# Patient Record
Sex: Male | Born: 1980 | Race: White | Hispanic: No | Marital: Single | Smoking: Former smoker
Health system: Southern US, Community
[De-identification: ages and names within clinical notes are randomized; demographics above are authoritative.]

## PROBLEM LIST (undated history)

## (undated) DIAGNOSIS — K5790 Diverticulosis of intestine, part unspecified, without perforation or abscess without bleeding: Secondary | ICD-10-CM

## (undated) DIAGNOSIS — E119 Type 2 diabetes mellitus without complications: Secondary | ICD-10-CM

## (undated) HISTORY — PX: INNER EAR SURGERY: SHX679

## (undated) HISTORY — PX: TONSILLECTOMY: SUR1361

---

## 2000-06-27 ENCOUNTER — Emergency Department (HOSPITAL_COMMUNITY): Admission: EM | Admit: 2000-06-27 | Discharge: 2000-06-27 | Payer: Self-pay | Admitting: Emergency Medicine

## 2000-06-27 ENCOUNTER — Encounter: Payer: Self-pay | Admitting: Emergency Medicine

## 2006-10-08 ENCOUNTER — Emergency Department (HOSPITAL_COMMUNITY): Admission: EM | Admit: 2006-10-08 | Discharge: 2006-10-08 | Payer: Self-pay | Admitting: Family Medicine

## 2006-11-04 ENCOUNTER — Emergency Department (HOSPITAL_COMMUNITY): Admission: EM | Admit: 2006-11-04 | Discharge: 2006-11-04 | Payer: Self-pay | Admitting: Emergency Medicine

## 2007-09-11 ENCOUNTER — Emergency Department (HOSPITAL_COMMUNITY): Admission: EM | Admit: 2007-09-11 | Discharge: 2007-09-11 | Payer: Self-pay | Admitting: Emergency Medicine

## 2009-04-17 ENCOUNTER — Emergency Department (HOSPITAL_COMMUNITY): Admission: EM | Admit: 2009-04-17 | Discharge: 2009-04-18 | Payer: Self-pay | Admitting: Emergency Medicine

## 2009-10-01 ENCOUNTER — Emergency Department (HOSPITAL_COMMUNITY): Admission: EM | Admit: 2009-10-01 | Discharge: 2009-10-01 | Payer: Self-pay | Admitting: Emergency Medicine

## 2011-11-08 ENCOUNTER — Emergency Department (HOSPITAL_COMMUNITY)
Admission: EM | Admit: 2011-11-08 | Discharge: 2011-11-08 | Disposition: A | Discharge status: Home / Self Care | Payer: No Typology Code available for payment source | Attending: Emergency Medicine | Admitting: Emergency Medicine

## 2011-11-08 ENCOUNTER — Encounter (HOSPITAL_COMMUNITY): Payer: Self-pay | Admitting: Emergency Medicine

## 2011-11-08 DIAGNOSIS — R509 Fever, unspecified: Secondary | ICD-10-CM | POA: Insufficient documentation

## 2011-11-08 DIAGNOSIS — Y92410 Unspecified street and highway as the place of occurrence of the external cause: Secondary | ICD-10-CM | POA: Insufficient documentation

## 2011-11-08 DIAGNOSIS — S139XXA Sprain of joints and ligaments of unspecified parts of neck, initial encounter: Secondary | ICD-10-CM | POA: Insufficient documentation

## 2011-11-08 DIAGNOSIS — T148XXA Other injury of unspecified body region, initial encounter: Secondary | ICD-10-CM

## 2011-11-08 MED ORDER — IBUPROFEN 800 MG PO TABS: 800.0000 mg | ORAL_TABLET | Freq: Three times a day (TID) | ORAL | Status: AC

## 2011-11-08 MED ORDER — IBUPROFEN 800 MG PO TABS
800.0000 mg | ORAL_TABLET | Freq: Once | ORAL | Status: AC
  Administered 2011-11-08: 800 mg via ORAL
  Filled 2011-11-08: qty 1

## 2011-11-08 MED ORDER — CYCLOBENZAPRINE HCL 10 MG PO TABS: 10.0000 mg | ORAL_TABLET | Freq: Two times a day (BID) | ORAL | Status: AC | PRN
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
## 2011-11-08 NOTE — Discharge Instructions (Signed)
Brad Nash you have muscle strain to your trapezius muscle in your neck.  Use ice intermitantly, ibuprofen 800 mg every 6 hours with food, and muscle relaxor as needed (do not drive with the muscle relaxor).  You will be sore for the next week probably. Stop using the ice after 24 hours then use heat.  Followup with her PCP from the list below or return to the ER for severe pain or any other concerns.  RESOURCE GUIDE  Dental Problems  Patients with Medicaid: [facility] [phone] [address]                                           [phone] [address] Phone:  [phone]                                                  Phone:  [phone]  If unable to pay or uninsured, contact:  [facility] or [facility]. to become qualified for the adult dental clinic.  Chronic Pain Problems Contact [facility]  [phone] Patients need to be referred by their primary care doctor.  Insufficient Money for Medicine Contact [employer]:  call "211" or [facility] 251-466-6300.  No Primary Care Doctor Call [facility]  [phone] Other agencies that provide inexpensive medical care    [facility]  [phone]    [facility]  [phone]    [facility]  623-575-3499    [facility]  [phone]    [facility]  909-692-3605    [facility]  [phone]  Psychological Services [facility]  [phone] [facility] Services  [phone] [facility]   [phone] (emergency services 904-088-2610)  Substance Abuse Resources [facility]  512-796-9217 [facility] 430-840-2292 [facility] 236-605-5658 Floydene Flock (973)[phone] Residential & Outpatient Substance Abuse Program  314-150-6625  Abuse/Neglect [facility] Child Abuse Hotline 9368386501 [facility] Child Abuse Hotline (218)756-7277 (After  Hours)  Emergency Shelter [facility] (807)557-9253  Maternity Homes [facility] at [facility] [place] [facility] 760-212-6102 [facility] 276-715-4092  MRSA Hotline #:   929-641-9911    [facility] Resources  [facility]     [employer]                          [facility] Dept. [address] [place]                       [address]      [address]  Patrecia Pace  Michell Heinrich Phone:  [phone]                                   Phone:  [phone]                 Phone:  [phone]  [facility] Phone:  [phone])[phone]  [facility] Child Abuse Hotline 201-340-0857 361-852-0169 (After Hours)     Muscle Strain A muscle strain (pulled muscle) happens when a muscle is over-stretched. Recovery usually takes 5 to 6 weeks.  HOME CARE   Put ice on the injured area.   Put ice in a plastic bag.   Place a towel between your skin and the bag.   Leave the ice on for 15 to 20 minutes at a time, every hour for the first 2 days.   Do not use the muscle for several days or until your doctor says you can. Do not use the muscle if you have pain.   Wrap the injured area with an elastic bandage for comfort. Do not put it on too tightly.   Only take medicine as told by your doctor.   Warm up before exercise. This helps prevent muscle strains.  GET HELP RIGHT AWAY IF:  There is increased pain or puffiness (swelling) in the affected area. MAKE SURE YOU:   Understand these instructions.   Will watch your condition.   Will get help right away if you are not doing well or get worse.  Document Released: 05/06/2008 Document Revised: 07/17/2011 Document Reviewed: 05/06/2008 [facility] Patient Information 2012 [place], [place].Muscle Strain A muscle strain (pulled muscle) happens when a muscle is over-stretched. Recovery  usually takes 5 to 6 weeks.  HOME CARE   Put ice on the injured area.   Put ice in a plastic bag.   Place a towel between your skin and the bag.   Leave the ice on for 15 to 20 minutes at a time, every hour for the first 2 days.   Do not use the muscle for several days or until your doctor says you can. Do not use the muscle if you have pain.   Wrap the injured area with an elastic bandage for comfort. Do not put it on too tightly.   Only take medicine as told by your doctor.   Warm up before exercise. This helps prevent muscle strains.  GET HELP RIGHT AWAY IF:  There is increased pain or puffiness (swelling) in the affected area. MAKE SURE YOU:   Understand these instructions.   Will watch your condition.   Will get help right away if you are not doing well or get worse.  Document Released: 05/06/2008 Document Revised: 07/17/2011 Document Reviewed: 05/06/2008 [facility] Patient Information 2012 [place], [place].Muscle Strain A muscle strain (pulled muscle) happens when a muscle is over-stretched. Recovery usually takes 5 to 6 weeks.  HOME CARE   Put ice on the injured area.   Put ice in a plastic bag.   Place a towel between your skin and the bag.   Leave the ice on for 15 to 20 minutes at a time, every hour for the first 2 days.   Do not use the muscle for several days or until your doctor says you can. Do not use the muscle if you have pain.   Wrap the injured area with an elastic bandage for comfort. Do not put it on too  tightly.   Only take medicine as told by your doctor.   Warm up before exercise. This helps prevent muscle strains.  GET HELP RIGHT AWAY IF:  There is increased pain or puffiness (swelling) in the affected area. MAKE SURE YOU:   Understand these instructions.   Will watch your condition.   Will get help right away if you are not doing well or get worse.  Document Released: 05/06/2008 Document Revised: 07/17/2011 Document Reviewed:  05/06/2008 [facility] Patient Information 2012 [place], [place].Cryotherapy Cryotherapy means treatment with cold. Ice or gel packs can be used to reduce both pain and swelling. Ice is the most helpful within the first 24 to 48 hours after an injury or flareup from overusing a muscle or joint. Sprains, strains, spasms, burning pain, shooting pain, and aches can all be eased with ice. Ice can also be used when recovering from surgery. Ice is effective, has very few side effects, and is safe for most people to use. PRECAUTIONS  Ice is not a safe treatment option for people with:  Raynaud's phenomenon. This is a condition affecting small blood vessels in the extremities. Exposure to cold may cause your problems to return.   Cold hypersensitivity. There are many forms of cold hypersensitivity, including:   Cold urticaria. Red, itchy hives appear on the skin when the tissues begin to warm after being iced.   Cold erythema. This is a red, itchy rash caused by exposure to cold.   Cold hemoglobinuria. Red blood cells break down when the tissues begin to warm after being iced. The hemoglobin that carry oxygen are passed into the urine because they cannot combine with blood proteins fast enough.   Numbness or altered sensitivity in the area being iced.  If you have any of the following conditions, do not use ice until you have discussed cryotherapy with your caregiver:  Heart conditions, such as arrhythmia, angina, or chronic heart disease.   High blood pressure.   Healing wounds or open skin in the area being iced.   Current infections.   Rheumatoid arthritis.   Poor circulation.   Diabetes.  Ice slows the blood flow in the region it is applied. This is beneficial when trying to stop inflamed tissues from spreading irritating chemicals to surrounding tissues. However, if you expose your skin to cold temperatures for too long or without the proper protection, you can damage your skin or nerves.  Watch for signs of skin damage due to cold. HOME CARE INSTRUCTIONS Follow these tips to use ice and cold packs safely.  Place a dry or damp towel between the ice and skin. A damp towel will cool the skin more quickly, so you may need to shorten the time that the ice is used.   For a more rapid response, add gentle compression to the ice.   Ice for no more than 10 to 20 minutes at a time. The bonier the area you are icing, the less time it will take to get the benefits of ice.   Check your skin after 5 minutes to make sure there are no signs of a poor response to cold or skin damage.   Rest 20 minutes or more in between uses.   Once your skin is numb, you can end your treatment. You can test numbness by very lightly touching your skin. The touch should be so light that you do not see the skin dimple from the pressure of your fingertip. When using ice, most people will feel  these normal sensations in this order: cold, burning, aching, and numbness.   Do not use ice on someone who cannot communicate their responses to pain, such as small children or people with dementia.  HOW TO MAKE AN ICE PACK Ice packs are the most common way to use ice therapy. Other methods include ice massage, ice baths, and cryo-sprays. Muscle creams that cause a cold, tingly feeling do not offer the same benefits that ice offers and should not be used as a substitute unless recommended by your caregiver. To make an ice pack, do one of the following:  Place crushed ice or a bag of frozen vegetables in a sealable plastic bag. Squeeze out the excess air. Place this bag inside another plastic bag. Slide the bag into a pillowcase or place a damp towel between your skin and the bag.   Mix 3 parts water with 1 part rubbing alcohol. Freeze the mixture in a sealable plastic bag. When you remove the mixture from the freezer, it will be slushy. Squeeze out the excess air. Place this bag inside another plastic bag. Slide the bag into  a pillowcase or place a damp towel between your skin and the bag.  SEEK MEDICAL CARE IF:  You develop white spots on your skin. This may give the skin a blotchy (mottled) appearance.   Your skin turns blue or pale.   Your skin becomes waxy or hard.   Your swelling gets worse.  MAKE SURE YOU:   Understand these instructions.   Will watch your condition.   Will get help right away if you are not doing well or get worse.  Document Released: 03/24/2011 Document Revised: 07/17/2011 Document Reviewed: 03/24/2011 [facility] Patient Information 2012 [place], [place].

## 2011-11-09 NOTE — ED Provider Notes (Signed)
Medical screening examination/treatment/procedure(s) were performed by non-physician practitioner and as supervising physician I was immediately available for consultation/collaboration.   Carleene Cooper III, MD 11/09/11 [phone]

## 2015-08-22 ENCOUNTER — Emergency Department (HOSPITAL_COMMUNITY): Payer: Self-pay

## 2015-08-22 ENCOUNTER — Emergency Department (HOSPITAL_COMMUNITY)
Admission: EM | Admit: 2015-08-22 | Discharge: 2015-08-22 | Disposition: A | Discharge status: Home / Self Care | Payer: Self-pay | Attending: Emergency Medicine | Admitting: Emergency Medicine

## 2015-08-22 ENCOUNTER — Encounter (HOSPITAL_COMMUNITY): Payer: Self-pay | Admitting: Emergency Medicine

## 2015-08-22 DIAGNOSIS — S8001XA Contusion of right knee, initial encounter: Secondary | ICD-10-CM | POA: Insufficient documentation

## 2015-08-22 DIAGNOSIS — Y9339 Activity, other involving climbing, rappelling and jumping off: Secondary | ICD-10-CM | POA: Insufficient documentation

## 2015-08-22 DIAGNOSIS — Y9289 Other specified places as the place of occurrence of the external cause: Secondary | ICD-10-CM | POA: Insufficient documentation

## 2015-08-22 DIAGNOSIS — Y998 Other external cause status: Secondary | ICD-10-CM | POA: Insufficient documentation

## 2015-08-22 DIAGNOSIS — W108XXA Fall (on) (from) other stairs and steps, initial encounter: Secondary | ICD-10-CM | POA: Insufficient documentation

## 2015-08-22 MED ORDER — NAPROXEN 500 MG PO TABS: 500.0000 mg | ORAL_TABLET | Freq: Two times a day (BID) | ORAL | Status: DC

## 2015-08-22 NOTE — ED Notes (Signed)
Pt states he was climbing a pole and missed a step. Pt states he felt something weird in his knee, now it feels different than the left one.

## 2015-08-22 NOTE — Discharge Instructions (Signed)
Ice and elevate your knee. You can use Ace wrap for swelling and compression. No strenuous activity for next 3 days. Naproxen for pain and inflammation. Follow up with orthopedics if not improving  Knee Pain Knee pain is a very common symptom and can have many causes. Knee pain often goes away when you follow your health care provider's instructions for relieving pain and discomfort at home. However, knee pain can develop into a condition that needs treatment. Some conditions may include:  Arthritis caused by wear and tear (osteoarthritis).  Arthritis caused by swelling and irritation (rheumatoid arthritis or gout).  A cyst or growth in your knee.  An infection in your knee joint.  An injury that will not heal.  Damage, swelling, or irritation of the tissues that support your knee (torn ligaments or tendinitis). If your knee pain continues, additional tests may be ordered to diagnose your condition. Tests may include X-rays or other imaging studies of your knee. You may also need to have fluid removed from your knee. Treatment for ongoing knee pain depends on the cause, but treatment may include:  Medicines to relieve pain or swelling.  Steroid injections in your knee.  Physical therapy.  Surgery. HOME CARE INSTRUCTIONS  Take medicines only as directed by your health care provider.  Rest your knee and keep it raised (elevated) while you are resting.  Do not do things that cause or worsen pain.  Avoid high-impact activities or exercises, such as running, jumping rope, or doing jumping jacks.  Apply ice to the knee area:  Put ice in a plastic bag.  Place a towel between your skin and the bag.  Leave the ice on for 20 minutes, 2-3 times a day.  Ask your health care provider if you should wear an elastic knee support.  Keep a pillow under your knee when you sleep.  Lose weight if you are overweight. Extra weight can put pressure on your knee.  Do not use any tobacco  products, including cigarettes, chewing tobacco, or electronic cigarettes. If you need help quitting, ask your health care provider. Smoking may slow the healing of any bone and joint problems that you may have. SEEK MEDICAL CARE IF:  Your knee pain continues, changes, or gets worse.  You have a fever along with knee pain.  Your knee buckles or locks up.  Your knee becomes more swollen. SEEK IMMEDIATE MEDICAL CARE IF:   Your knee joint feels hot to the touch.  You have chest pain or trouble breathing.   This information is not intended to replace advice given to you by your health care provider. Make sure you discuss any questions you have with your health care provider.   Document Released: 05/25/2007 Document Revised: 08/18/2014 Document Reviewed: 03/13/2014 Elsevier Interactive Patient Education [employer].

## 2015-08-22 NOTE — ED Provider Notes (Signed)
CSN: [id number]     Arrival date & time 08/22/15  1400 History  By signing my name below, I, Irene Pap, attest that this documentation has been prepared under the direction and in the presence of Jeannett Senior, PA-C Electronically Signed: Irene Pap, ED Scribe. 08/22/2015. 3:46 PM.   Chief Complaint  Patient presents with  . Knee Injury    right   The history is provided by the patient. No language interpreter was used.  HPI Comments: Brad Nash is a 35 y.o. male who presents to the Emergency Department complaining of right knee injury onset a few hours ago. Pt states that he was climbing a pole and missed a step, falling onto his right knee in the mud from 10 feet. He reports hearing a clicking noise and states that it feels different from his left knee. He states that he was able to ambulate, but it is painful. He reports worsening pain with bending at the knee. He denies hitting head, back pain, joint swelling, color change, wound, numbness, weakness, or LOC.    History reviewed. No pertinent past medical history. History reviewed. No pertinent past surgical history. History reviewed. No pertinent family history. Social History  Substance Use Topics  . Smoking status: None  . Smokeless tobacco: None  . Alcohol Use: No    Review of Systems  Musculoskeletal: Positive for arthralgias. Negative for back pain and joint swelling.  Skin: Negative for color change and wound.  Neurological: Negative for syncope, weakness and numbness.   Allergies  Review of patient's allergies indicates no known allergies.  Home Medications   Prior to Admission medications   Medication Sig Start Date End Date Taking? Authorizing Provider  acetaminophen (TYLENOL) 500 MG tablet Take 1,500 mg by mouth every 6 (six) hours as needed. For pain    Historical Provider, MD   BP 144/87 mmHg  Pulse 88  Temp(Src) 98.3 F (36.8 C) (Oral)  Resp 20  SpO2 100% Physical Exam  Constitutional: He is  oriented to person, place, and time. He appears well-developed and well-nourished.  HENT:  Head: Normocephalic and atraumatic.  Eyes: EOM are normal.  Neck: Normal range of motion. Neck supple.  Cardiovascular: Normal rate.   Pulmonary/Chest: Effort normal.  Musculoskeletal: Normal range of motion.  Normal-appearing right knee. There is no obvious swelling. No obvious joint effusion. Full range of motion. Anterior knee tenderness. No tenderness over medial, lateral joint, no posterior knee tenderness.  Joint is stable and negative anterior-posterior drawer signs. No laxity with medial lateral stress. Dorsal pedal pulse intact.  Neurological: He is alert and oriented to person, place, and time.  Skin: Skin is warm and dry.  Psychiatric: He has a normal mood and affect. His behavior is normal.  Nursing note and vitals reviewed.   ED Course  Procedures (including critical care time) DIAGNOSTIC STUDIES: Oxygen Saturation is 100% on RA, normal by my interpretation.    COORDINATION OF CARE: 3:42 PM-Discussed treatment plan which includes x-ray, RICE techniques, anti-inflammatories, and follow up with orthopedics with pt at bedside and pt agreed to plan.    Labs Review Labs Reviewed - No data to display  Imaging Review Dg Knee Complete 4 Views Right  08/22/2015  CLINICAL DATA:  RIGHT knee injury, was climbing a pole and missed a step, RIGHT knee struck pole, anterolateral pain, feels something weird in his knee different from LEFT EXAM: RIGHT KNEE - COMPLETE 4+ VIEW COMPARISON:  None FINDINGS: Two small radio opacities are identified at  the medial soft tissues of the RIGHT knee on two views but not on remaining views, question artifacts. Osseous mineralization normal. Joint spaces preserved. No acute fracture, dislocation, or bone destruction. No knee joint effusion. Small patellar spur at quadriceps tendon insertion. IMPRESSION: No acute osseous abnormalities. Question radiopaque foreign  bodies versus artifacts at medial soft tissues of the RIGHT leg at the level of the proximal tibia. Electronically Signed   By: Lavonia Dana M.D.   On: 08/22/2015 14:40   I have personally reviewed and evaluated these images and lab results as part of my medical decision-making.   EKG Interpretation None      MDM   Final diagnoses:  Knee contusion, right, initial encounter    patient is here after a fall. He is complaining of right knee pain. No other injuries. He is ambulatory in the leg with no limp. Exam is really unremarkable. X-rays negative. Most likely contusion versus sprain. Home with ice, elevation, follow up with orthopedics as needed. NSAIDs for pain and inflammation  Filed Vitals:   08/22/15 1407  BP: 144/87  Pulse: 88  Temp: 98.3 F (36.8 C)  TempSrc: Oral  Resp: 20  SpO2: 100%   I personally performed the services described in this documentation, which was scribed in my presence. The recorded information has been reviewed and is accurate.    Jeannett Senior, PA-C 08/22/15 1838  Virgel Manifold, MD 08/23/15 1023

## 2015-08-22 NOTE — ED Notes (Signed)
Patient was alert, oriented and stable upon discharge. RN went over AVS and patient had no further questions.  

## 2017-09-01 DIAGNOSIS — J181 Lobar pneumonia, unspecified organism: Secondary | ICD-10-CM | POA: Insufficient documentation

## 2017-09-02 ENCOUNTER — Encounter (HOSPITAL_COMMUNITY): Payer: Self-pay | Admitting: Family Medicine

## 2017-09-02 ENCOUNTER — Emergency Department (HOSPITAL_COMMUNITY)
Admission: EM | Admit: 2017-09-02 | Discharge: 2017-09-02 | Disposition: A | Discharge status: Home / Self Care | Payer: Self-pay | Attending: Emergency Medicine | Admitting: Emergency Medicine

## 2017-09-02 ENCOUNTER — Emergency Department (HOSPITAL_COMMUNITY): Payer: Self-pay

## 2017-09-02 DIAGNOSIS — J181 Lobar pneumonia, unspecified organism: Secondary | ICD-10-CM

## 2017-09-02 DIAGNOSIS — R0781 Pleurodynia: Secondary | ICD-10-CM

## 2017-09-02 DIAGNOSIS — J189 Pneumonia, unspecified organism: Secondary | ICD-10-CM

## 2017-09-02 MED ORDER — AZITHROMYCIN 250 MG PO TABS
500.0000 mg | ORAL_TABLET | Freq: Once | ORAL | Status: AC
  Administered 2017-09-02: 500 mg via ORAL
  Filled 2017-09-02: qty 2

## 2017-09-02 MED ORDER — AZITHROMYCIN 250 MG PO TABS: 250.0000 mg | ORAL_TABLET | Freq: Every day | ORAL | 0 refills | Status: DC

## 2017-09-02 MED ORDER — OXYCODONE-ACETAMINOPHEN 5-325 MG PO TABS
1.0000 | ORAL_TABLET | Freq: Once | ORAL | Status: AC
  Administered 2017-09-02: 1 via ORAL
  Filled 2017-09-02: qty 1

## 2017-09-02 MED ORDER — TRAMADOL HCL 50 MG PO TABS: 50.0000 mg | ORAL_TABLET | Freq: Four times a day (QID) | ORAL | 0 refills | Status: DC | PRN

## 2017-09-02 NOTE — ED Triage Notes (Signed)
Patient reports he started experiencing left sided abd/rib area pain for the last two days with coughing. However, tonight he developed a constant pain when he was hooking up a propane heater. Denies nausea, vomiting, and fever.

## 2017-09-02 NOTE — Discharge Instructions (Signed)
Take the prescribed medication as directed. Follow-up with the wellness clinic to ensure this is resolving-- call for appt. Return to the ED for new or worsening symptoms.

## 2017-09-02 NOTE — ED Provider Notes (Signed)
[facility] DEPT Provider Note   CSN: [id number] Arrival date & time: 09/01/17  2338     History   Chief Complaint Chief Complaint  Patient presents with  . Abdominal Pain  . Chest Pain    HPI Brad Nash is a 37 y.o. male.  The history is provided by the patient and medical records.     37 year old male with no significant past medical history presenting to the ED for left-sided rib pain.  Reports he had a cold last week and was coughing quite a bit.  States his ribs on the left side have been giving him some issues, worse over the past 24 hours.  States a lot of pain in the left mid ribs, notably worse with deep breathing, cough, eating or movement.  He denies any shortness of breath.  Still has a slight cough that is dry nonproductive.  No fever or chills.  No nausea, vomiting, or diarrhea.  History reviewed. No pertinent past medical history.  There are no active problems to display for this patient.   History reviewed. No pertinent surgical history.     Home Medications    Prior to Admission medications   Medication Sig Start Date End Date Taking? Authorizing Provider  acetaminophen (TYLENOL) 500 MG tablet Take 1,500 mg by mouth every 6 (six) hours as needed. For pain    [provider]  naproxen (NAPROSYN) 500 MG tablet Take 1 tablet (500 mg total) by mouth 2 (two) times daily. 08/22/15   Jeannett Senior, PA-C    Family History History reviewed. No pertinent family history.  Social History Social History   Tobacco Use  . Smoking status: Never Smoker  . Smokeless tobacco: Never Used  Substance Use Topics  . Alcohol use: No  . Drug use: Yes    Types: Marijuana    Comment: 2-3 times a week.      Allergies   Patient has no known allergies.   Review of Systems Review of Systems   Physical Exam Updated Vital Signs BP 127/90 (BP Location: Left Arm)   Pulse 99   Temp 98.2 F (36.8 C) (Oral)   Resp 18    SpO2 95%   Physical Exam  Constitutional: He is oriented to person, place, and time. He appears well-developed and well-nourished.  HENT:  Head: Normocephalic and atraumatic.  Mouth/Throat: Oropharynx is clear and moist.  Eyes: Conjunctivae and EOM are normal. Pupils are equal, round, and reactive to light.  Neck: Normal range of motion.  Cardiovascular: Normal rate, regular rhythm and normal heart sounds.  Pulmonary/Chest: Effort normal and breath sounds normal.  Tenderness of left ribs as depicted; no focal deformities or crepitus; lungs clear bilaterally; no distress; no bruising or other signs of trauma    Abdominal: Soft. Bowel sounds are normal.  Musculoskeletal: Normal range of motion.  Neurological: He is alert and oriented to person, place, and time.  Skin: Skin is warm and dry.  Psychiatric: He has a normal mood and affect.  Nursing note and vitals reviewed.    ED Treatments / Results  Labs (all labs ordered are listed, but only abnormal results are displayed) Labs Reviewed - No data to display  EKG  EKG Interpretation None       Radiology Dg Ribs Unilateral W/chest Left  Result Date: 09/02/2017 CLINICAL DATA:  Initial evaluation for acute cough for several weeks, left-sided rib pain. EXAM: LEFT RIBS AND CHEST - 3+ VIEW COMPARISON:  Prior radiograph from  10/01/2009. FINDINGS: Cardiac and mediastinal silhouettes are within normal limits. Lungs are mildly hypoinflated. Diffuse peribronchial thickening is present, slightly greater within the left lung, suggesting possible acute bronchiolitis. Slightly more confluent patchy opacity at the left lung base may reflect atelectasis or possibly early/developing alveolar infiltrate. No pulmonary edema or pleural effusion. No pneumothorax. Dedicated views of the left-sided ribs were performed. Metallic BB marker overlies the lower left ribs at site of pain. No acute displaced rib fracture or other abnormality. No other acute  osseous abnormality. IMPRESSION: 1. No acute rib fracture or other osseous abnormality. 2. Scattered diffuse peribronchial thickening, greater within the left lung, suggesting acute bronchiolitis. Slightly more confluent patchy opacity at the left lung base may reflect atelectasis or possibly early/developing alveolar infiltrate. Electronically Signed   By: Jeannine Boga M.D.   On: 09/02/2017 02:43    Procedures Procedures (including critical care time)  Medications Ordered in ED Medications - No data to display   Initial Impression / Assessment and Plan / ED Course  I have reviewed the triage vital signs and the nursing notes.  Pertinent labs & imaging results that were available during my care of the patient were reviewed by me and considered in my medical decision making (see chart for details).  37 year old male here with left rib pain.  States he had a cold last week but is continued having rib pain, especially with coughing, deep breathing, movement.  Has pain in the ribs as depicted above, however no deformity or crepitus of the chest wall.  No bruising or other signs of trauma.  His lungs are overall clear and he is in no acute respiratory distress.  Rib films obtained, no acute rib fracture, does have peribronchial thickening with patchy opacity at the left lung base.  Given the nature of his symptoms, will treat for early pneumonia--first dose given here.  Does not currently have PCP or insurance, will refer to wellness clinic for follow-up.  He understands to return here for any new or worsening symptoms.  Patient was discharged home in stable condition.  Final Clinical Impressions(s) / ED Diagnoses   Final diagnoses:  Rib pain on left side  Community acquired pneumonia of left lower lobe of lung [facility])    ED Discharge Orders        Ordered    azithromycin (ZITHROMAX) 250 MG tablet  Daily     09/02/17 0337    traMADol (ULTRAM) 50 MG tablet  Every 6 hours PRN     09/02/17  0337       Larene Pickett, PA-C 09/02/17 English, Ankit, MD 09/02/17 [phone]

## 2018-11-08 ENCOUNTER — Emergency Department (HOSPITAL_COMMUNITY): Payer: Self-pay

## 2018-11-08 ENCOUNTER — Other Ambulatory Visit: Payer: Self-pay

## 2018-11-08 ENCOUNTER — Encounter (HOSPITAL_COMMUNITY): Payer: Self-pay | Admitting: Emergency Medicine

## 2018-11-08 ENCOUNTER — Emergency Department (HOSPITAL_COMMUNITY)
Admission: EM | Admit: 2018-11-08 | Discharge: 2018-11-08 | Disposition: A | Discharge status: Home / Self Care | Payer: Self-pay | Attending: Emergency Medicine | Admitting: Emergency Medicine

## 2018-11-08 DIAGNOSIS — Y929 Unspecified place or not applicable: Secondary | ICD-10-CM | POA: Insufficient documentation

## 2018-11-08 DIAGNOSIS — W19XXXA Unspecified fall, initial encounter: Secondary | ICD-10-CM

## 2018-11-08 DIAGNOSIS — Y998 Other external cause status: Secondary | ICD-10-CM | POA: Insufficient documentation

## 2018-11-08 DIAGNOSIS — W01198A Fall on same level from slipping, tripping and stumbling with subsequent striking against other object, initial encounter: Secondary | ICD-10-CM | POA: Insufficient documentation

## 2018-11-08 DIAGNOSIS — S300XXA Contusion of lower back and pelvis, initial encounter: Secondary | ICD-10-CM | POA: Insufficient documentation

## 2018-11-08 DIAGNOSIS — Y9389 Activity, other specified: Secondary | ICD-10-CM | POA: Insufficient documentation

## 2018-11-08 MED ORDER — NAPROXEN 500 MG PO TABS: 500.0000 mg | ORAL_TABLET | Freq: Two times a day (BID) | ORAL | 0 refills | Status: AC

## 2018-11-08 NOTE — ED Provider Notes (Signed)
[facility] DEPT Provider Note   CSN: [id number] Arrival date & time: 11/08/18  1746    History   Chief Complaint Chief Complaint  Patient presents with  . Fall  . Tailbone Pain    HPI Brad Nash is a 38 y.o. male.     39 y.o male with no PMH presents to the ED s/p fall this evening.  Patient reports he was pulling on a rope when he fell backwards landing on staircase and hurting his tailbone.  He reports pain along the tailbone region, reports his pain is worse with palpation and sitting.  Pain is alleviated with walking and removing pressure from the site.  Patient has taken 400 mg Motrin, reports no improvement in symptoms.  He has been able to urinate, had a bowel movement prior to arrival without complaints.  He denies any weakness, other complaints at this time.     History reviewed. No pertinent past medical history.  There are no active problems to display for this patient.   Past Surgical History:  Procedure Laterality Date  . INNER EAR SURGERY    . TONSILLECTOMY          Home Medications    Prior to Admission medications   Medication Sig Start Date End Date Taking? Authorizing Provider  azithromycin (ZITHROMAX) 250 MG tablet Take 1 tablet (250 mg total) by mouth daily. Take first 2 tablets together, then 1 every day until finished. 09/02/17   Larene Pickett, PA-C  naproxen (NAPROSYN) 500 MG tablet Take 1 tablet (500 mg total) by mouth 2 (two) times daily for 7 days. 11/08/18 11/15/18  Janeece Fitting, PA-C  traMADol (ULTRAM) 50 MG tablet Take 1 tablet (50 mg total) by mouth every 6 (six) hours as needed. 09/02/17   Larene Pickett, PA-C    Family History No family history on file.  Social History Social History   Tobacco Use  . Smoking status: Never Smoker  . Smokeless tobacco: Never Used  Substance Use Topics  . Alcohol use: No  . Drug use: Yes    Types: Marijuana    Comment: 2-3 times a week.      Allergies    Patient has no known allergies.   Review of Systems Review of Systems  Constitutional: Negative for fever.  Genitourinary: Negative for difficulty urinating.  Musculoskeletal: Positive for arthralgias, back pain and myalgias. Negative for gait problem.     Physical Exam Updated Vital Signs BP (!) 144/110 (BP Location: Right Arm)   Pulse 95   Temp 98.1 F (36.7 C) (Oral)   Resp 18   SpO2 97%   Physical Exam Vitals signs and nursing note reviewed.  Constitutional:      Appearance: Normal appearance. He is well-developed.  HENT:     Head: Normocephalic and atraumatic.  Eyes:     General: No scleral icterus.    Pupils: Pupils are equal, round, and reactive to light.  Neck:     Musculoskeletal: Normal range of motion.  Cardiovascular:     Heart sounds: Normal heart sounds.  Pulmonary:     Effort: Pulmonary effort is normal.     Breath sounds: Normal breath sounds. No wheezing.  Chest:     Chest wall: No tenderness.  Abdominal:     General: Bowel sounds are normal. There is no distension.     Palpations: Abdomen is soft.     Tenderness: There is no abdominal tenderness.  Musculoskeletal:  General: No tenderness or deformity.     Lumbar back: He exhibits pain. He exhibits no swelling, no edema, no deformity and no laceration.       Back:  Skin:    General: Skin is warm and dry.  Neurological:     Mental Status: He is alert and oriented to person, place, and time.      ED Treatments / Results  Labs (all labs ordered are listed, but only abnormal results are displayed) Labs Reviewed - No data to display  EKG None  Radiology Dg Sacrum/coccyx  Result Date: 11/08/2018 CLINICAL DATA:  Fall.  Tailbone pain with sitting. EXAM: SACRUM AND COCCYX - 2+ VIEW COMPARISON:  None. FINDINGS: There is no evidence of fracture or other focal bone lesions. IMPRESSION: Negative. Electronically Signed   By: Kerby Moors M.D.   On: 11/08/2018 19:28    Procedures  Procedures (including critical care time)  Medications Ordered in ED Medications - No data to display   Initial Impression / Assessment and Plan / ED Course  I have reviewed the triage vital signs and the nursing notes.  Pertinent labs & imaging results that were available during my care of the patient were reviewed by me and considered in my medical decision making (see chart for details).    Patient presents to the ED s/p fall while pulling landing on his coccyx.  He reports taking some Motrin for pain relief, some relieving symptoms.  During evaluation there is bruising, tenderness to palpation along the coccyx region and lower back region.  Will obtain imaging to rule out any acute process.  Patient has been able to urinate along with defecate without complaints.  Denies any other complaints at this time.   X-ray of his sacrum reveal no dislocation, fracture.  Will provide patient with prescription for NSAIDs along with instructions to obtain a donut shaped pillow to help with his pain.  He is also advised to apply ice or heat to the area. Patient understands and agrees with management.   Patient is requesting a work note, will provide this for patient.  He also reports he would like to be off for a week, he was asking about a recheck in the emergency department, recommended that he go to urgent care in the upcoming weeks.  He does not have a current PCP.  Return precautions provided at length.  Final Clinical Impressions(s) / ED Diagnoses   Final diagnoses:  Fall, initial encounter  Contusion of coccyx, initial encounter    ED Discharge Orders         Ordered    naproxen (NAPROSYN) 500 MG tablet  2 times daily     11/08/18 1928           Janeece Fitting, [place] 11/08/18 1941    Davonna Belling, MD 11/08/18 2329

## 2018-11-08 NOTE — ED Triage Notes (Signed)
Pt reports that water hose was caught in fence and when pulling he slipped and fell on butt couple hours ago. Reports severe tail pains with sitting.

## 2018-11-08 NOTE — Discharge Instructions (Addendum)
I have prescribed anti-inflammatories to help with your pain.  Please take 1 tablet twice a day for the next 7 days.  You may also benefit from heat or ice to the area.  We have discussed the use of a donut pillow, please use this as directed.  If you need a reevaluation for your pain or work note you may proceed to the urgent care at [facility].

## 2018-11-14 ENCOUNTER — Encounter (HOSPITAL_COMMUNITY): Payer: Self-pay | Admitting: Emergency Medicine

## 2018-11-14 ENCOUNTER — Ambulatory Visit (HOSPITAL_COMMUNITY)
Admission: EM | Admit: 2018-11-14 | Discharge: 2018-11-14 | Disposition: A | Discharge status: Home / Self Care | Payer: Self-pay | Attending: Family Medicine | Admitting: Family Medicine

## 2018-11-14 ENCOUNTER — Other Ambulatory Visit: Payer: Self-pay

## 2018-11-14 DIAGNOSIS — S300XXD Contusion of lower back and pelvis, subsequent encounter: Secondary | ICD-10-CM

## 2018-11-14 MED ORDER — CYCLOBENZAPRINE HCL 5 MG PO TABS: 5.0000 mg | ORAL_TABLET | Freq: Every day | ORAL | 0 refills | Status: DC

## 2018-11-14 NOTE — Discharge Instructions (Signed)
Please start naproxen, up to 500mg , twice a day to help with pain.  Flexeril at night as needed. May cause drowsiness. Please do not take if driving or drinking alcohol.  Ice application.  Activity as tolerated.  Use of donut pillow with any sitting.

## 2018-11-14 NOTE — ED Triage Notes (Signed)
Pt here for pain in tailbone area after falling on 3/30

## 2018-11-14 NOTE — ED Provider Notes (Signed)
[place]    CSN: [id number] Arrival date & time: 11/14/18  1634     History   Chief Complaint Chief Complaint  Patient presents with  . Tailbone Pain    HPI Brad Nash is a 38 y.o. male.   Brad Nash presents with complaints of persistent tailbone and low back pain. Had a fall 3/30, went to ER with xrays completed which were normal. Continued pain. Worse with sitting or with certain movements. Currently at rest pain 2/10. Can spike up to 10/10. No loss of bladder or bowel function. No weakness, numbness or tingling to extremities. States he works in [employer] and he feels he is unable to perform his job due to the pain. He has been taking tylenol which has minimally helped with symptoms. Has been using a donut pillow and has been sleeping on his side. Doesn't have a PCP.    ROS per HPI, negative if not otherwise mentioned.      History reviewed. No pertinent past medical history.  There are no active problems to display for this patient.   Past Surgical History:  Procedure Laterality Date  . INNER EAR SURGERY    . TONSILLECTOMY         Home Medications    Prior to Admission medications   Medication Sig Start Date End Date Taking? Authorizing Provider  azithromycin (ZITHROMAX) 250 MG tablet Take 1 tablet (250 mg total) by mouth daily. Take first 2 tablets together, then 1 every day until finished. 09/02/17   Larene Pickett, PA-C  cyclobenzaprine (FLEXERIL) 5 MG tablet Take 1 tablet (5 mg total) by mouth at bedtime. 11/14/18   Zigmund Gottron, NP  naproxen (NAPROSYN) 500 MG tablet Take 1 tablet (500 mg total) by mouth 2 (two) times daily for 7 days. 11/08/18 11/15/18  Janeece Fitting, PA-C  traMADol (ULTRAM) 50 MG tablet Take 1 tablet (50 mg total) by mouth every 6 (six) hours as needed. 09/02/17   Larene Pickett, PA-C    Family History History reviewed. No pertinent family history.  Social History Social History   Tobacco Use  . Smoking status:  Never Smoker  . Smokeless tobacco: Never Used  Substance Use Topics  . Alcohol use: No  . Drug use: Yes    Types: Marijuana    Comment: 2-3 times a week.      Allergies   Patient has no known allergies.   Review of Systems Review of Systems   Physical Exam Triage Vital Signs ED Triage Vitals [11/14/18 1655]  Enc Vitals Group     BP (!) 162/102     Pulse Rate 97     Resp 18     Temp 98.1 F (36.7 C)     Temp Source Oral     SpO2 96 %     Weight      Height      Head Circumference      Peak Flow      Pain Score 8     Pain Loc      Pain Edu?      Excl. in [place]?    No data found.  Updated Vital Signs BP (!) 162/102 (BP Location: Right Arm)   Pulse 97   Temp 98.1 F (36.7 C) (Oral)   Resp 18   SpO2 96%   Visual Acuity Right Eye Distance:   Left Eye Distance:   Bilateral Distance:    Right Eye Near:   Left Eye  Near:    Bilateral Near:     Physical Exam Constitutional:      Appearance: He is well-developed. He is obese.  Cardiovascular:     Rate and Rhythm: Normal rate and regular rhythm.  Pulmonary:     Effort: Pulmonary effort is normal.     Breath sounds: Normal breath sounds.  Musculoskeletal:     Lumbar back: He exhibits tenderness and bony tenderness.     Comments: Ambulatory in room   Skin:    General: Skin is warm and dry.  Neurological:     Mental Status: He is alert and oriented to person, place, and time.      UC Treatments / Results  Labs (all labs ordered are listed, but only abnormal results are displayed) Labs Reviewed - No data to display  EKG None  Radiology No results found.  Procedures Procedures (including critical care time)  Medications Ordered in UC Medications - No data to display  Initial Impression / Assessment and Plan / UC Course  I have reviewed the triage vital signs and the nursing notes.  Pertinent labs & imaging results that were available during my care of the patient were reviewed by me and  considered in my medical decision making (see chart for details).     Patient with previous evaluation and work up in ER, was directed to return to [facility] for additional work note. No change in symptoms, no worsening. 3 additional days provided. Discussed limitation in time off provisions from UC. Follow up with PCP as needed. Encouraged activity as tolerated. Patient verbalized understanding and agreeable to plan.  Ambulatory out of clinic without difficulty.    Final Clinical Impressions(s) / UC Diagnoses   Final diagnoses:  Contusion of coccyx, subsequent encounter     Discharge Instructions     Please start naproxen, up to 500mg , twice a day to help with pain.  Flexeril at night as needed. May cause drowsiness. Please do not take if driving or drinking alcohol.  Ice application.  Activity as tolerated.  Use of donut pillow with any sitting.    ED Prescriptions    Medication Sig Dispense Auth. Provider   cyclobenzaprine (FLEXERIL) 5 MG tablet Take 1 tablet (5 mg total) by mouth at bedtime. 15 tablet Zigmund Gottron, NP     Controlled Substance Prescriptions [place] Controlled Substance Registry consulted? Not Applicable   Zigmund Gottron, NP 11/14/18 1725

## 2018-11-15 ENCOUNTER — Telehealth: Payer: Self-pay | Admitting: General Practice

## 2018-11-15 ENCOUNTER — Ambulatory Visit: Payer: Self-pay | Admitting: Family Medicine

## 2018-11-15 ENCOUNTER — Encounter: Payer: Self-pay | Admitting: Family Medicine

## 2018-11-15 DIAGNOSIS — S300XXA Contusion of lower back and pelvis, initial encounter: Secondary | ICD-10-CM

## 2018-11-15 NOTE — Telephone Encounter (Signed)
Patient needed to establish care as well as have a hospital follow up, Dr Zigmund Daniel is going to see him but said it needs to be face to face and is going to have [facility] call to prescreen and set up an appointment

## 2018-11-15 NOTE — Patient Instructions (Signed)
Continue naproxen and flexeril as needed Ice area 2-3 times each day Please let me know if this is not improving.

## 2018-11-15 NOTE — Assessment & Plan Note (Signed)
-  Continue current home care with NSAID, muscle relaxer and frequent icing.  -May utilize rubber donut as needed for comfort -Not provided to be out of work x1 week from today.  -He will call if this continues to not improve.

## 2018-11-15 NOTE — Progress Notes (Signed)
Brad Nash - 38 y.o. male MRN [id number]  Date of birth: [birth year]  Subjective Chief Complaint  Patient presents with  . Pain    8 days ago , fell on stairs, went to ED & Urgent care,     HPI Brad Nash is a 38 y.o. male without known chronic medical problems here today for initial visit and ER follow up.   He reports having a fall down his stairs after getting tangled with his dogs leash.  He landed on his buttock area.  This occurred on 11/08/2018.  He was seen in ED on same day as injury with negative xray.  He followed up at Urgent care on 11/14/2018.  He was given rx for flexeril and naproxen and written out of work until 11/17/2018.  He reports that pain is still present and he has some mild swelling.  He is icing area and has gotten some improvement with prescribed medications.  He denies radiation of pain, numbness, tingling or weakness.  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   ROS:  A comprehensive ROS was completed and negative except as noted per HPI  No Known Allergies  No past medical history on file.  Past Surgical History:  Procedure Laterality Date  . INNER EAR SURGERY    . TONSILLECTOMY      Social History   Socioeconomic History  . Marital status: Single    Spouse name: Not on file  . Number of children: Not on file  . Years of education: Not on file  . Highest education level: Not on file  Occupational History  . Not on file  Social Needs  . Financial resource strain: Not on file  . Food insecurity:    Worry: Not on file    Inability: Not on file  . Transportation needs:    Medical: Not on file    Non-medical: Not on file  Tobacco Use  . Smoking status: Never Smoker  . Smokeless tobacco: Never Used  Substance and Sexual Activity  . Alcohol use: No  . Drug use: Yes    Types: Marijuana    Comment: 2-3 times a week.   Marland Kitchen Sexual activity: Not on file  Lifestyle  . Physical activity:     Days per week: Not on file    Minutes per session: Not on file  . Stress: Not on file  Relationships  . Social connections:    Talks on phone: Not on file    Gets together: Not on file    Attends religious service: Not on file    Active member of club or organization: Not on file    Attends meetings of clubs or organizations: Not on file    Relationship status: Not on file  Other Topics Concern  . Not on file  Social History Narrative  . Not on file    No family history on file.  Health Maintenance  Topic Date Due  . HIV Screening  05/26/1995  . TETANUS/TDAP  05/26/1999  . INFLUENZA VACCINE  03/12/2019    ----------------------------------------------------------------------------------------------------------------------------------------------------------------------------------------------------------------- Physical Exam BP 126/60 (BP Location: Right Arm, Patient Position: Standing, Cuff Size: Large)   Pulse (!) 104   Temp (!) 97.4 F (36.3 C) (Oral)   Ht 5\' 6"  (1.676 m)   Wt 280 lb 6.4 oz (127.2 kg)   SpO2 99%   BMI 45.26 kg/m   Physical Exam Constitutional:  Appearance: Normal appearance.  HENT:     Head: Normocephalic and atraumatic.     Mouth/Throat:     Mouth: Mucous membranes are moist.  Eyes:     General: No scleral icterus. Cardiovascular:     Rate and Rhythm: Normal rate and regular rhythm.  Pulmonary:     Effort: Pulmonary effort is normal.     Breath sounds: Normal breath sounds.  Musculoskeletal:     Comments: TTP along mid sacrum to coccyx, mild surrounding bruising.   Skin:    General: Skin is warm and dry.  Neurological:     General: No focal deficit present.     Mental Status: He is alert.  Psychiatric:        Mood and Affect: Mood normal.        Behavior: Behavior normal.      ------------------------------------------------------------------------------------------------------------------------------------------------------------------------------------------------------------------- Assessment and Plan  Coccyx contusion -Continue current home care with NSAID, muscle relaxer and frequent icing.  -May utilize rubber donut as needed for comfort -Not provided to be out of work x1 week from today.  -He will call if this continues to not improve.

## 2020-11-22 ENCOUNTER — Encounter (HOSPITAL_COMMUNITY): Payer: Self-pay | Admitting: Emergency Medicine

## 2020-11-22 ENCOUNTER — Emergency Department (HOSPITAL_COMMUNITY): Payer: 59

## 2020-11-22 ENCOUNTER — Inpatient Hospital Stay (HOSPITAL_COMMUNITY)
Admission: EM | Admit: 2020-11-22 | Discharge: 2020-11-25 | DRG: 392 | Disposition: A | Discharge status: Home / Self Care | Payer: 59 | Attending: General Surgery | Admitting: General Surgery

## 2020-11-22 ENCOUNTER — Other Ambulatory Visit: Payer: Self-pay

## 2020-11-22 DIAGNOSIS — K5720 Diverticulitis of large intestine with perforation and abscess without bleeding: Secondary | ICD-10-CM | POA: Diagnosis not present

## 2020-11-22 DIAGNOSIS — E669 Obesity, unspecified: Secondary | ICD-10-CM | POA: Diagnosis present

## 2020-11-22 DIAGNOSIS — K5780 Diverticulitis of intestine, part unspecified, with perforation and abscess without bleeding: Secondary | ICD-10-CM | POA: Diagnosis present

## 2020-11-22 DIAGNOSIS — Z7984 Long term (current) use of oral hypoglycemic drugs: Secondary | ICD-10-CM

## 2020-11-22 DIAGNOSIS — R1032 Left lower quadrant pain: Secondary | ICD-10-CM | POA: Diagnosis not present

## 2020-11-22 DIAGNOSIS — Z79899 Other long term (current) drug therapy: Secondary | ICD-10-CM

## 2020-11-22 DIAGNOSIS — E119 Type 2 diabetes mellitus without complications: Secondary | ICD-10-CM | POA: Diagnosis present

## 2020-11-22 DIAGNOSIS — Z20822 Contact with and (suspected) exposure to covid-19: Secondary | ICD-10-CM | POA: Diagnosis present

## 2020-11-22 DIAGNOSIS — Z6841 Body Mass Index (BMI) 40.0 and over, adult: Secondary | ICD-10-CM

## 2020-11-22 LAB — URINALYSIS, ROUTINE W REFLEX MICROSCOPIC
Bacteria, UA: NONE SEEN
Bilirubin Urine: NEGATIVE
Glucose, UA: 50 mg/dL — AB
Hgb urine dipstick: NEGATIVE
Ketones, ur: 20 mg/dL — AB
Leukocytes,Ua: NEGATIVE
Nitrite: NEGATIVE
Protein, ur: 100 mg/dL — AB
Specific Gravity, Urine: 1.027 (ref 1.005–1.030)
pH: 5 (ref 5.0–8.0)

## 2020-11-22 LAB — COMPREHENSIVE METABOLIC PANEL
ALT: 123 U/L — ABNORMAL HIGH (ref 0–44)
AST: 87 U/L — ABNORMAL HIGH (ref 15–41)
Albumin: 4.1 g/dL (ref 3.5–5.0)
Alkaline Phosphatase: 62 U/L (ref 38–126)
Anion gap: 10 (ref 5–15)
BUN: 13 mg/dL (ref 6–20)
CO2: 23 mmol/L (ref 22–32)
Calcium: 9.3 mg/dL (ref 8.9–10.3)
Chloride: 102 mmol/L (ref 98–111)
Creatinine, Ser: 0.77 mg/dL (ref 0.61–1.24)
GFR, Estimated: >60 mL/min (ref >60)
Glucose, Bld: 186 mg/dL — ABNORMAL HIGH (ref 70–99)
Potassium: 3.8 mmol/L (ref 3.5–5.1)
Sodium: 135 mmol/L (ref 135–145)
Total Bilirubin: 0.5 mg/dL (ref 0.3–1.2)
Total Protein: 8.3 g/dL — ABNORMAL HIGH (ref 6.5–8.1)

## 2020-11-22 LAB — CBC WITH DIFFERENTIAL/PLATELET
Abs Immature Granulocytes: 0.04 10*3/uL (ref 0.00–0.07)
Basophils Absolute: 0 10*3/uL (ref 0.0–0.1)
Basophils Relative: 0 %
Eosinophils Absolute: 0.1 10*3/uL (ref 0.0–0.5)
Eosinophils Relative: 1 %
HCT: 48.3 % (ref 39.0–52.0)
Hemoglobin: 16.4 g/dL (ref 13.0–17.0)
Immature Granulocytes: 0 %
Lymphocytes Relative: 14 %
Lymphs Abs: 1.4 10*3/uL (ref 0.7–4.0)
MCH: 28.9 pg (ref 26.0–34.0)
MCHC: 34 g/dL (ref 30.0–36.0)
MCV: 85 fL (ref 80.0–100.0)
Monocytes Absolute: 0.7 10*3/uL (ref 0.1–1.0)
Monocytes Relative: 7 %
Neutro Abs: 7.6 10*3/uL (ref 1.7–7.7)
Neutrophils Relative %: 78 %
Platelets: 275 10*3/uL (ref 150–400)
RBC: 5.68 MIL/uL (ref 4.22–5.81)
RDW: 13 % (ref 11.5–15.5)
WBC: 9.9 10*3/uL (ref 4.0–10.5)
nRBC: 0 % (ref 0.0–0.2)

## 2020-11-22 LAB — LIPASE, BLOOD: Lipase: 33 U/L (ref 11–51)

## 2020-11-22 MED ORDER — SODIUM CHLORIDE 0.9 % IV BOLUS
1000.0000 mL | Freq: Once | INTRAVENOUS | Status: AC
  Administered 2020-11-22: 1000 mL via INTRAVENOUS

## 2020-11-22 MED ORDER — IOHEXOL 300 MG/ML  SOLN
100.0000 mL | Freq: Once | INTRAMUSCULAR | Status: AC | PRN
  Administered 2020-11-22: 100 mL via INTRAVENOUS

## 2020-11-22 MED ORDER — PIPERACILLIN-TAZOBACTAM 3.375 G IVPB 30 MIN
3.3750 g | Freq: Once | INTRAVENOUS | Status: AC
Start: 2020-11-22 — End: 2020-11-23
  Administered 2020-11-23: 3.375 g via INTRAVENOUS
  Filled 2020-11-22: qty 50

## 2020-11-22 MED ORDER — MORPHINE SULFATE (PF) 4 MG/ML IV SOLN
4.0000 mg | Freq: Once | INTRAVENOUS | Status: AC
  Administered 2020-11-22: 4 mg via INTRAVENOUS
  Filled 2020-11-22: qty 1

## 2020-11-22 MED ORDER — ONDANSETRON HCL 4 MG/2ML IJ SOLN
4.0000 mg | Freq: Once | INTRAMUSCULAR | Status: AC
  Administered 2020-11-22: 4 mg via INTRAVENOUS
  Filled 2020-11-22: qty 2

## 2020-11-22 NOTE — ED Provider Notes (Signed)
[facility] DEPT Provider Note   CSN: [id number] Arrival date & time: 11/22/20  1838     History Chief Complaint  Patient presents with  . Abdominal Pain    Brad Nash is a 40 y.o. male.  Brad Nash is a 40 y.o. male who is otherwise healthy, presents to the ED for evaluation of abdominal pain.  Pain started 2 days ago.  Present primarily on the left side of his abdomen.  He reports it is been constant and worsening.  He reports today he has felt nauseated but has not vomited.  He reports he is also had a few episodes of nonbloody diarrhea.  Has had chills but no measured fever.  He reports no prior history of similar abdominal pains.  No prior history of abdominal surgery.  He has not tried any medications to treat symptoms prior to arrival.  He reports yesterday he had 2 minutes of brief chest pain, none continued today.  No other aggravating or alleviating factors.        History reviewed. No pertinent past medical history.  Patient Active Problem List   Diagnosis Date Noted  . Coccyx contusion 11/15/2018    Past Surgical History:  Procedure Laterality Date  . INNER EAR SURGERY    . TONSILLECTOMY         No family history on file.  Social History   Tobacco Use  . Smoking status: Never Smoker  . Smokeless tobacco: Never Used  Vaping Use  . Vaping Use: Never used  Substance Use Topics  . Alcohol use: No  . Drug use: Yes    Types: Marijuana    Comment: 2-3 times a week.     Home Medications Prior to Admission medications   Medication Sig Start Date End Date Taking? Authorizing Provider  ELDERBERRY PO Take 1 capsule by mouth daily.   Yes [provider]  gemfibrozil (LOPID) 600 MG tablet Take 600 mg by mouth 2 (two) times daily. 11/05/20  Yes [provider]  metFORMIN (GLUCOPHAGE) 500 MG tablet Take 500 mg by mouth in the morning and at bedtime. 11/05/20  Yes [provider]  Multiple Vitamin  (MULTIVITAMIN WITH MINERALS) TABS tablet Take 1 tablet by mouth daily.   Yes [provider]  Omega-3 Fatty Acids (FISH OIL PO) Take 1 capsule by mouth daily.   Yes [provider]    Allergies    Patient has no known allergies.  Review of Systems   Review of Systems  Constitutional: Positive for chills. Negative for fever.  Respiratory: Negative for cough and shortness of breath.   Cardiovascular: Negative for chest pain.  Gastrointestinal: Positive for abdominal pain, diarrhea and nausea. Negative for blood in stool and vomiting.  Genitourinary: Negative for dysuria and frequency.  Musculoskeletal: Negative for arthralgias and myalgias.  Skin: Negative for color change and rash.  Neurological: Negative for dizziness, syncope and light-headedness.  All other systems reviewed and are negative.   Physical Exam Updated Vital Signs BP (!) 183/108   Pulse 91   Temp 99.4 F (37.4 C) (Oral)   Resp 18   SpO2 100%   Physical Exam Vitals and nursing note reviewed.  Constitutional:      General: He is not in acute distress.    Appearance: He is well-developed. He is obese. He is ill-appearing. He is not diaphoretic.     Comments: Patient is alert, but appears very uncomfortable and is somewhat ill-appearing  HENT:  Head: Normocephalic and atraumatic.     Mouth/Throat:     Mouth: Mucous membranes are moist.     Pharynx: Oropharynx is clear.  Eyes:     General:        Right eye: No discharge.        Left eye: No discharge.     Pupils: Pupils are equal, round, and reactive to light.  Cardiovascular:     Rate and Rhythm: Normal rate and regular rhythm.     Heart sounds: Normal heart sounds. No murmur heard. No friction rub. No gallop.   Pulmonary:     Effort: Pulmonary effort is normal. No respiratory distress.     Breath sounds: Normal breath sounds. No wheezing or rales.     Comments: Respirations equal and unlabored, patient able to speak in full  sentences, lungs clear to auscultation bilaterally  Abdominal:     General: Bowel sounds are normal. There is no distension.     Palpations: Abdomen is soft. There is no mass.     Tenderness: There is abdominal tenderness. There is no guarding.     Comments: Obese abdomen is protuberant but soft, bowel sounds present, there is tenderness in the left upper and lower quadrants, most severe in the left lower quadrant with some guarding, other quadrants nontender to palpation.  Musculoskeletal:        General: No deformity.     Cervical back: Neck supple.  Skin:    General: Skin is warm and dry.     Capillary Refill: Capillary refill takes less than 2 seconds.  Neurological:     Mental Status: He is alert.     Coordination: Coordination normal.     Comments: Speech is clear, able to follow commands Moves extremities without ataxia, coordination intact  Psychiatric:        Mood and Affect: Mood normal.        Behavior: Behavior normal.     ED Results / Procedures / Treatments   Labs (all labs ordered are listed, but only abnormal results are displayed) Labs Reviewed  COMPREHENSIVE METABOLIC PANEL - Abnormal; Notable for the following components:      Result Value   Glucose, Bld 186 (*)    Total Protein 8.3 (*)    AST 87 (*)    ALT 123 (*)    All other components within normal limits  URINALYSIS, ROUTINE W REFLEX MICROSCOPIC - Abnormal; Notable for the following components:   Glucose, UA 50 (*)    Ketones, ur 20 (*)    Protein, ur 100 (*)    All other components within normal limits  RESP PANEL BY RT-PCR (FLU A&B, COVID) ARPGX2  CBC WITH DIFFERENTIAL/PLATELET  LIPASE, BLOOD    EKG EKG Interpretation  Date/Time:  Thursday November 22 2020 20:30:23 EDT Ventricular Rate:  93 PR Interval:  127 QRS Duration: 84 QT Interval:  319 QTC Calculation: 397 R Axis:   8 Text Interpretation: Sinus rhythm Left ventricular hypertrophy Baseline wander in lead(s) V4 No old tracing to  compare Confirmed by Daleen Bo [phone]) on 11/22/2020 9:11:21 PM   Radiology CT Abdomen Pelvis W Contrast  Result Date: 11/22/2020 CLINICAL DATA:  Left quadrant abdominal pain EXAM: CT ABDOMEN AND PELVIS WITH CONTRAST TECHNIQUE: Multidetector CT imaging of the abdomen and pelvis was performed using the standard protocol following bolus administration of intravenous contrast. CONTRAST:  150mL OMNIPAQUE IOHEXOL 300 MG/ML  SOLN COMPARISON:  None. FINDINGS: Lower chest: Lung bases are clear. Normal  heart size. No pericardial effusion. Hepatobiliary: Diffuse hepatic hypoattenuation compatible with hepatic steatosis. Sparing seen gallbladder fossa. No concerning focal liver lesion. Smooth liver surface contour. Normal gallbladder and biliary tree without visible calcified gallstone or biliary dilatation. Pancreas: No pancreatic ductal dilatation or surrounding inflammatory changes. Spleen: Normal in size. No concerning splenic lesions. Adrenals/Urinary Tract: Normal adrenal glands. Kidneys are normally located with symmetric enhancement and excretion. No suspicious renal lesion, urolithiasis or hydronephrosis. Urinary bladder is unremarkable for degree of distension. Stomach/Bowel: Distal esophagus, stomach and duodenum are unremarkable. No small bowel thickening or dilatation. Normal appendix in the right lower quadrant. Proximal colon is fairly bland in appearance. There is focal thickening of the proximal sigmoid and distal descending colon centered upon a culprit diverticula in the left lower quadrant (2/64) with some adjacent areas of punctate extraluminal gas and associated phlegmon with thickening of the adjacent peritoneum concerning for a perforated diverticulitis no large spillage of bowel contents is seen. No organized collection or abscess. No resulting obstruction. Vascular/Lymphatic: No significant vascular findings are present. No pathologically enlarged abdominal or pelvic lymph nodes. Reactive  adenopathy seen in the mesentery and left lower quadrant. Reproductive: The prostate and seminal vesicles are unremarkable. Other: Phlegmon and inflammation with small amount of extraluminal gas centered upon the culprit diverticulum of the proximal sigmoid in the left lower quadrant. Associated adjacent features of peritonitis as well. No organized abscess or collection. No bowel containing hernia. Tiny fat containing umbilical hernia. Musculoskeletal: No acute osseous abnormality or suspicious osseous lesion. IMPRESSION: 1. Acute perforated diverticulitis of the proximal sigmoid/distal descending colon centered upon a culprit diverticulum in the left lower quadrant with small amount of extraluminal gas, associated phlegmon with thickening of the adjacent peritoneum to suggest features associated peritonitis. No organized abscess or collection. No large spillage of bowel contents, organized abscess or collection is seen. 2. Hepatic steatosis. Electronically Signed   By: Lovena Le M.D.   On: 11/22/2020 22:43    Procedures .Critical Care Performed by: Jacqlyn Larsen, PA-C Authorized by: Jacqlyn Larsen, PA-C   Critical care provider statement:    Critical care time (minutes):  45   Critical care was necessary to treat or prevent imminent or life-threatening deterioration of the following conditions: Perforated diverticulitis.   Critical care was time spent personally by me on the following activities:  Discussions with consultants, evaluation of patient's response to treatment, examination of patient, ordering and performing treatments and interventions, ordering and review of laboratory studies, ordering and review of radiographic studies, pulse oximetry, re-evaluation of patient's condition, obtaining history from patient or surrogate and review of old charts     Medications Ordered in ED Medications  piperacillin-tazobactam (ZOSYN) IVPB 3.375 g (3.375 g Intravenous New Bag/Given 11/23/20 0010)   acetaminophen (TYLENOL) tablet 1,000 mg (has no administration in time range)  sodium chloride 0.9 % bolus 1,000 mL (has no administration in time range)  ondansetron (ZOFRAN) injection 4 mg (4 mg Intravenous Given 11/22/20 2057)  sodium chloride 0.9 % bolus 1,000 mL (0 mLs Intravenous Stopped 11/22/20 2330)  morphine 4 MG/ML injection 4 mg (4 mg Intravenous Given 11/22/20 2059)  iohexol (OMNIPAQUE) 300 MG/ML solution 100 mL (100 mLs Intravenous Contrast Given 11/22/20 2224)    ED Course  I have reviewed the triage vital signs and the nursing notes.  Pertinent labs & imaging results that were available during my care of the patient were reviewed by me and considered in my medical decision making (see chart for details).  MDM Rules/Calculators/A&P                         Patient presents to the ED with complaints of abdominal pain. Patient nontoxic appearing, in no apparent distress, hypertensive, vitals otherwise normal. On exam patient tender to palpation in the left upper quadrants of the abdomen with guarding in the left lower quadrant. Will evaluate with labs and CT abdomen pelvis. Analgesics, anti-emetics, and fluids administered.   Concerning for possible diverticulitis, colitis, perforation, obstruction, anemia, gastritis, colitis, less likely appendicitis cholecystitis given location  Additional history obtained:  Additional history obtained from chart review & nursing note review.   Lab Tests:  I Ordered, reviewed, and interpreted labs, which included:  CBC: No leukocytosis, normal hemoglobin CMP: Glucose of 186 but no other significant electrolyte derangements, normal renal function, mildly elevated AST and ALT Lipase: WNL UA: Some proteinuria, no hematuria or signs of infection.  Imaging Studies ordered:  I ordered imaging studies which included CT abdomen pelvis, I independently reviewed, called by radiologist regarding imaging, scan shows perforated diverticulitis with  small amount of extraluminal gas, some phlegmonous changes but no abscess.  Some thickening of the peritoneum consistent with peritonitis.   ED Course:   Discussed perforated diverticulitis noted on CT with patient and mother at bedside.  Patient started on IV Zosyn ordered Covid screening for admission.  Consult placed to general surgery.  Case discussed with Dr. Leighton Ruff with general surgery who will see and admit the patient.  Portions of this note were generated with [employer]. Dictation errors may occur despite best attempts at proofreading.  Final Clinical Impression(s) / ED Diagnoses Final diagnoses:  Diverticulitis of colon with perforation    Rx / DC Orders ED Discharge Orders    None       Janet Berlin 11/23/20 0033    Drenda Freeze, MD 11/27/20 [phone]

## 2020-11-22 NOTE — H&P (Signed)
CC: abd pain   HPI: Brad Nash is an 40 y.o. male who is here for acute onset LLQ on the evening of 4/13.  Associated with diarrhea.  Subjective fevers.  No N/V  History reviewed. No pertinent past medical history.  Past Surgical History:  Procedure Laterality Date  . INNER EAR SURGERY    . TONSILLECTOMY      No family history on file.  Social:  reports that he has never smoked. He has never used smokeless tobacco. He reports current drug use. Drug: Marijuana. He reports that he does not drink alcohol.  Allergies: No Known Allergies  Medications: I have reviewed the patient's current medications.  Results for orders placed or performed during the hospital encounter of 11/22/20 (from the past 48 hour(s))  Comprehensive metabolic panel     Status: Abnormal   Collection Time: 11/22/20  9:00 PM  Result Value Ref Range   Sodium 135 135 - 145 mmol/L   Potassium 3.8 3.5 - 5.1 mmol/L   Chloride 102 98 - 111 mmol/L   CO2 23 22 - 32 mmol/L   Glucose, Bld 186 (H) 70 - 99 mg/dL    Comment: Glucose reference range applies only to samples taken after fasting for at least 8 hours.   BUN 13 6 - 20 mg/dL   Creatinine, Ser 0.77 0.61 - 1.24 mg/dL   Calcium 9.3 8.9 - 10.3 mg/dL   Total Protein 8.3 (H) 6.5 - 8.1 g/dL   Albumin 4.1 3.5 - 5.0 g/dL   AST 87 (H) 15 - 41 U/L   ALT 123 (H) 0 - 44 U/L   Alkaline Phosphatase 62 38 - 126 U/L   Total Bilirubin 0.5 0.3 - 1.2 mg/dL   GFR, Estimated >60 >60 mL/min    Comment: (NOTE) Calculated using the CKD-EPI Creatinine Equation (2021)    Anion gap 10 5 - 15    Comment: Performed at [facility], [place] [address] [place], [place] [address]  CBC with Differential     Status: None   Collection Time: 11/22/20  9:00 PM  Result Value Ref Range   WBC 9.9 4.0 - 10.5 K/uL   RBC 5.68 4.22 - 5.81 MIL/uL   Hemoglobin 16.4 13.0 - 17.0 g/dL   HCT 48.3 39.0 - 52.0 %   MCV 85.0 80.0 - 100.0 fL   MCH 28.9 26.0 - 34.0 pg   MCHC 34.0 30.0  - 36.0 g/dL   RDW 13.0 11.5 - 15.5 %   Platelets 275 150 - 400 K/uL   nRBC 0.0 0.0 - 0.2 %   Neutrophils Relative % 78 %   Neutro Abs 7.6 1.7 - 7.7 K/uL   Lymphocytes Relative 14 %   Lymphs Abs 1.4 0.7 - 4.0 K/uL   Monocytes Relative 7 %   Monocytes Absolute 0.7 0.1 - 1.0 K/uL   Eosinophils Relative 1 %   Eosinophils Absolute 0.1 0.0 - 0.5 K/uL   Basophils Relative 0 %   Basophils Absolute 0.0 0.0 - 0.1 K/uL   Immature Granulocytes 0 %   Abs Immature Granulocytes 0.04 0.00 - 0.07 K/uL    Comment: Performed at [facility], [place] [address] [place], [place] [address]  Lipase, blood     Status: None   Collection Time: 11/22/20  9:00 PM  Result Value Ref Range   Lipase 33 11 - 51 U/L    Comment: Performed at [facility], [place] [address] [place], [place]  [address]  Urinalysis, Routine w reflex microscopic Urine, Clean Catch     Status: Abnormal   Collection Time: 11/22/20  9:00 PM  Result Value Ref Range   Color, Urine YELLOW YELLOW   APPearance CLEAR CLEAR   Specific Gravity, Urine 1.027 1.005 - 1.030   pH 5.0 5.0 - 8.0   Glucose, UA 50 (A) NEGATIVE mg/dL   Hgb urine dipstick NEGATIVE NEGATIVE   Bilirubin Urine NEGATIVE NEGATIVE   Ketones, ur 20 (A) NEGATIVE mg/dL   Protein, ur 100 (A) NEGATIVE mg/dL   Nitrite NEGATIVE NEGATIVE   Leukocytes,Ua NEGATIVE NEGATIVE   RBC / HPF 0-5 0 - 5 RBC/hpf   WBC, UA 0-5 0 - 5 WBC/hpf   Bacteria, UA NONE SEEN NONE SEEN   Squamous Epithelial / LPF 0-5 0 - 5   Mucus PRESENT     Comment: Performed at [facility], [place] [address], [place], [place] [address]    CT Abdomen Pelvis W Contrast  Result Date: 11/22/2020 CLINICAL DATA:  Left quadrant abdominal pain EXAM: CT ABDOMEN AND PELVIS WITH CONTRAST TECHNIQUE: Multidetector CT imaging of the abdomen and pelvis was performed using the standard protocol following bolus administration of intravenous contrast. CONTRAST:  143mL OMNIPAQUE  IOHEXOL 300 MG/ML  SOLN COMPARISON:  None. FINDINGS: Lower chest: Lung bases are clear. Normal heart size. No pericardial effusion. Hepatobiliary: Diffuse hepatic hypoattenuation compatible with hepatic steatosis. Sparing seen gallbladder fossa. No concerning focal liver lesion. Smooth liver surface contour. Normal gallbladder and biliary tree without visible calcified gallstone or biliary dilatation. Pancreas: No pancreatic ductal dilatation or surrounding inflammatory changes. Spleen: Normal in size. No concerning splenic lesions. Adrenals/Urinary Tract: Normal adrenal glands. Kidneys are normally located with symmetric enhancement and excretion. No suspicious renal lesion, urolithiasis or hydronephrosis. Urinary bladder is unremarkable for degree of distension. Stomach/Bowel: Distal esophagus, stomach and duodenum are unremarkable. No small bowel thickening or dilatation. Normal appendix in the right lower quadrant. Proximal colon is fairly bland in appearance. There is focal thickening of the proximal sigmoid and distal descending colon centered upon a culprit diverticula in the left lower quadrant (2/64) with some adjacent areas of punctate extraluminal gas and associated phlegmon with thickening of the adjacent peritoneum concerning for a perforated diverticulitis no large spillage of bowel contents is seen. No organized collection or abscess. No resulting obstruction. Vascular/Lymphatic: No significant vascular findings are present. No pathologically enlarged abdominal or pelvic lymph nodes. Reactive adenopathy seen in the mesentery and left lower quadrant. Reproductive: The prostate and seminal vesicles are unremarkable. Other: Phlegmon and inflammation with small amount of extraluminal gas centered upon the culprit diverticulum of the proximal sigmoid in the left lower quadrant. Associated adjacent features of peritonitis as well. No organized abscess or collection. No bowel containing hernia. Tiny fat  containing umbilical hernia. Musculoskeletal: No acute osseous abnormality or suspicious osseous lesion. IMPRESSION: 1. Acute perforated diverticulitis of the proximal sigmoid/distal descending colon centered upon a culprit diverticulum in the left lower quadrant with small amount of extraluminal gas, associated phlegmon with thickening of the adjacent peritoneum to suggest features associated peritonitis. No organized abscess or collection. No large spillage of bowel contents, organized abscess or collection is seen. 2. Hepatic steatosis. Electronically Signed   By: Lovena Le M.D.   On: 11/22/2020 22:43    ROS - all of the below systems have been reviewed with the patient and positives are indicated with bold text General: chills, fever or night sweats Eyes: blurry vision or double vision  ENT: epistaxis or sore throat Allergy/Immunology: itchy/watery eyes or nasal congestion Hematologic/Lymphatic: bleeding problems, blood clots or swollen lymph nodes Endocrine: temperature intolerance or unexpected weight changes Breast: new or changing breast lumps or nipple discharge Resp: cough, shortness of breath, or wheezing CV: chest pain or dyspnea on exertion GI: as per HPI GU: dysuria, trouble voiding, or hematuria MSK: joint pain or joint stiffness Neuro: TIA or stroke symptoms Derm: pruritus and skin lesion changes Psych: anxiety and depression  PE Blood pressure (!) 156/86, pulse 92, temperature 100 F (37.8 C), temperature source Oral, resp. rate (!) 25, SpO2 98 %. Constitutional: NAD; conversant; no deformities Eyes: Moist conjunctiva; no lid lag; anicteric; PERRL Neck: Trachea midline; no thyromegaly Lungs: Normal respiratory effort; no tactile fremitus CV: RRR; no palpable thrills; no pitting edema GI: Abd TTP LLQ; no palpable hepatosplenomegaly MSK: Normal range of motion of extremities; no clubbing/cyanosis Psychiatric: Appropriate affect; alert and oriented x3 Lymphatic: No  palpable cervical or axillary lymphadenopathy  Results for orders placed or performed during the hospital encounter of 11/22/20 (from the past 48 hour(s))  Comprehensive metabolic panel     Status: Abnormal   Collection Time: 11/22/20  9:00 PM  Result Value Ref Range   Sodium 135 135 - 145 mmol/L   Potassium 3.8 3.5 - 5.1 mmol/L   Chloride 102 98 - 111 mmol/L   CO2 23 22 - 32 mmol/L   Glucose, Bld 186 (H) 70 - 99 mg/dL    Comment: Glucose reference range applies only to samples taken after fasting for at least 8 hours.   BUN 13 6 - 20 mg/dL   Creatinine, Ser 0.77 0.61 - 1.24 mg/dL   Calcium 9.3 8.9 - 10.3 mg/dL   Total Protein 8.3 (H) 6.5 - 8.1 g/dL   Albumin 4.1 3.5 - 5.0 g/dL   AST 87 (H) 15 - 41 U/L   ALT 123 (H) 0 - 44 U/L   Alkaline Phosphatase 62 38 - 126 U/L   Total Bilirubin 0.5 0.3 - 1.2 mg/dL   GFR, Estimated >60 >60 mL/min    Comment: (NOTE) Calculated using the CKD-EPI Creatinine Equation (2021)    Anion gap 10 5 - 15    Comment: Performed at [facility], [place] [address], [place], [place] [address]  CBC with Differential     Status: None   Collection Time: 11/22/20  9:00 PM  Result Value Ref Range   WBC 9.9 4.0 - 10.5 K/uL   RBC 5.68 4.22 - 5.81 MIL/uL   Hemoglobin 16.4 13.0 - 17.0 g/dL   HCT 48.3 39.0 - 52.0 %   MCV 85.0 80.0 - 100.0 fL   MCH 28.9 26.0 - 34.0 pg   MCHC 34.0 30.0 - 36.0 g/dL   RDW 13.0 11.5 - 15.5 %   Platelets 275 150 - 400 K/uL   nRBC 0.0 0.0 - 0.2 %   Neutrophils Relative % 78 %   Neutro Abs 7.6 1.7 - 7.7 K/uL   Lymphocytes Relative 14 %   Lymphs Abs 1.4 0.7 - 4.0 K/uL   Monocytes Relative 7 %   Monocytes Absolute 0.7 0.1 - 1.0 K/uL   Eosinophils Relative 1 %   Eosinophils Absolute 0.1 0.0 - 0.5 K/uL   Basophils Relative 0 %   Basophils Absolute 0.0 0.0 - 0.1 K/uL   Immature Granulocytes 0 %   Abs Immature Granulocytes 0.04 0.00 - 0.07 K/uL    Comment: Performed at [facility], [address] [place], [place] [address]  Lipase, blood     Status: None   Collection Time: 11/22/20  9:00 PM  Result Value Ref Range   Lipase 33 11 - 51 U/L    Comment: Performed at [facility], [place] [address], [place], [place] [address]  Urinalysis, Routine w reflex microscopic Urine, Clean Catch     Status: Abnormal   Collection Time: 11/22/20  9:00 PM  Result Value Ref Range   Color, Urine YELLOW YELLOW   APPearance CLEAR CLEAR   Specific Gravity, Urine 1.027 1.005 - 1.030   pH 5.0 5.0 - 8.0   Glucose, UA 50 (A) NEGATIVE mg/dL   Hgb urine dipstick NEGATIVE NEGATIVE   Bilirubin Urine NEGATIVE NEGATIVE   Ketones, ur 20 (A) NEGATIVE mg/dL   Protein, ur 100 (A) NEGATIVE mg/dL   Nitrite NEGATIVE NEGATIVE   Leukocytes,Ua NEGATIVE NEGATIVE   RBC / HPF 0-5 0 - 5 RBC/hpf   WBC, UA 0-5 0 - 5 WBC/hpf   Bacteria, UA NONE SEEN NONE SEEN   Squamous Epithelial / LPF 0-5 0 - 5   Mucus PRESENT     Comment: Performed at [facility], [place] [address], [place], [place] [address]    CT Abdomen Pelvis W Contrast  Result Date: 11/22/2020 CLINICAL DATA:  Left quadrant abdominal pain EXAM: CT ABDOMEN AND PELVIS WITH CONTRAST TECHNIQUE: Multidetector CT imaging of the abdomen and pelvis was performed using the standard protocol following bolus administration of intravenous contrast. CONTRAST:  153mL OMNIPAQUE IOHEXOL 300 MG/ML  SOLN COMPARISON:  None. FINDINGS: Lower chest: Lung bases are clear. Normal heart size. No pericardial effusion. Hepatobiliary: Diffuse hepatic hypoattenuation compatible with hepatic steatosis. Sparing seen gallbladder fossa. No concerning focal liver lesion. Smooth liver surface contour. Normal gallbladder and biliary tree without visible calcified gallstone or biliary dilatation. Pancreas: No pancreatic ductal dilatation or surrounding inflammatory changes. Spleen: Normal in size. No concerning splenic lesions. Adrenals/Urinary Tract: Normal adrenal glands.  Kidneys are normally located with symmetric enhancement and excretion. No suspicious renal lesion, urolithiasis or hydronephrosis. Urinary bladder is unremarkable for degree of distension. Stomach/Bowel: Distal esophagus, stomach and duodenum are unremarkable. No small bowel thickening or dilatation. Normal appendix in the right lower quadrant. Proximal colon is fairly bland in appearance. There is focal thickening of the proximal sigmoid and distal descending colon centered upon a culprit diverticula in the left lower quadrant (2/64) with some adjacent areas of punctate extraluminal gas and associated phlegmon with thickening of the adjacent peritoneum concerning for a perforated diverticulitis no large spillage of bowel contents is seen. No organized collection or abscess. No resulting obstruction. Vascular/Lymphatic: No significant vascular findings are present. No pathologically enlarged abdominal or pelvic lymph nodes. Reactive adenopathy seen in the mesentery and left lower quadrant. Reproductive: The prostate and seminal vesicles are unremarkable. Other: Phlegmon and inflammation with small amount of extraluminal gas centered upon the culprit diverticulum of the proximal sigmoid in the left lower quadrant. Associated adjacent features of peritonitis as well. No organized abscess or collection. No bowel containing hernia. Tiny fat containing umbilical hernia. Musculoskeletal: No acute osseous abnormality or suspicious osseous lesion. IMPRESSION: 1. Acute perforated diverticulitis of the proximal sigmoid/distal descending colon centered upon a culprit diverticulum in the left lower quadrant with small amount of extraluminal gas, associated phlegmon with thickening of the adjacent peritoneum to suggest features associated peritonitis. No organized abscess or collection. No large spillage of bowel contents, organized abscess or collection is seen.  2. Hepatic steatosis. Electronically Signed   By: Lovena Le  M.D.   On: 11/22/2020 22:43     A/P: Jacobo Moncrief is an 40 y.o. male with acute perforated diverticulitis without abscess.  Clears, IV abx, IVF's.  Admit to floor bed   Rosario Adie, MD  [facility]

## 2020-11-22 NOTE — ED Triage Notes (Signed)
Patient reports lower abdominal pain, predominately left side with one episode of diarrhea today. Denies vomiting.

## 2020-11-22 NOTE — ED Provider Notes (Incomplete)
[facility] DEPT Provider Note   CSN: [id number] Arrival date & time: 11/22/20  1838     History Chief Complaint  Patient presents with  . Abdominal Pain    Brad Nash is a 40 y.o. male.  HPI     History reviewed. No pertinent past medical history.  Patient Active Problem List   Diagnosis Date Noted  . Coccyx contusion 11/15/2018    Past Surgical History:  Procedure Laterality Date  . INNER EAR SURGERY    . TONSILLECTOMY         No family history on file.  Social History   Tobacco Use  . Smoking status: Never Smoker  . Smokeless tobacco: Never Used  Vaping Use  . Vaping Use: Never used  Substance Use Topics  . Alcohol use: No  . Drug use: Yes    Types: Marijuana    Comment: 2-3 times a week.     Home Medications Prior to Admission medications   Medication Sig Start Date End Date Taking? Authorizing Provider  ELDERBERRY PO Take 1 capsule by mouth daily.   Yes [provider]  gemfibrozil (LOPID) 600 MG tablet Take 600 mg by mouth 2 (two) times daily. 11/05/20  Yes [provider]  metFORMIN (GLUCOPHAGE) 500 MG tablet Take 500 mg by mouth in the morning and at bedtime. 11/05/20  Yes [provider]  Multiple Vitamin (MULTIVITAMIN WITH MINERALS) TABS tablet Take 1 tablet by mouth daily.   Yes [provider]  Omega-3 Fatty Acids (FISH OIL PO) Take 1 capsule by mouth daily.   Yes [provider]    Allergies    Patient has no known allergies.  Review of Systems   Review of Systems  Physical Exam Updated Vital Signs BP (!) 183/108   Pulse 91   Temp 99.4 F (37.4 C) (Oral)   Resp 18   SpO2 100%   Physical Exam  ED Results / Procedures / Treatments   Labs (all labs ordered are listed, but only abnormal results are displayed) Labs Reviewed  COMPREHENSIVE METABOLIC PANEL - Abnormal; Notable for the following components:      Result Value   Glucose, Bld 186 (*)    Total  Protein 8.3 (*)    AST 87 (*)    ALT 123 (*)    All other components within normal limits  URINALYSIS, ROUTINE W REFLEX MICROSCOPIC - Abnormal; Notable for the following components:   Glucose, UA 50 (*)    Ketones, ur 20 (*)    Protein, ur 100 (*)    All other components within normal limits  RESP PANEL BY RT-PCR (FLU A&B, COVID) ARPGX2  CBC WITH DIFFERENTIAL/PLATELET  LIPASE, BLOOD    EKG EKG Interpretation  Date/Time:  Thursday November 22 2020 20:30:23 EDT Ventricular Rate:  93 PR Interval:  127 QRS Duration: 84 QT Interval:  319 QTC Calculation: 397 R Axis:   8 Text Interpretation: Sinus rhythm Left ventricular hypertrophy Baseline wander in lead(s) V4 No old tracing to compare Confirmed by Daleen Bo [phone]) on 11/22/2020 9:11:21 PM   Radiology CT Abdomen Pelvis W Contrast  Result Date: 11/22/2020 CLINICAL DATA:  Left quadrant abdominal pain EXAM: CT ABDOMEN AND PELVIS WITH CONTRAST TECHNIQUE: Multidetector CT imaging of the abdomen and pelvis was performed using the standard protocol following bolus administration of intravenous contrast. CONTRAST:  166mL OMNIPAQUE IOHEXOL 300 MG/ML  SOLN COMPARISON:  None. FINDINGS: Lower chest: Lung bases are clear. Normal heart size. No  pericardial effusion. Hepatobiliary: Diffuse hepatic hypoattenuation compatible with hepatic steatosis. Sparing seen gallbladder fossa. No concerning focal liver lesion. Smooth liver surface contour. Normal gallbladder and biliary tree without visible calcified gallstone or biliary dilatation. Pancreas: No pancreatic ductal dilatation or surrounding inflammatory changes. Spleen: Normal in size. No concerning splenic lesions. Adrenals/Urinary Tract: Normal adrenal glands. Kidneys are normally located with symmetric enhancement and excretion. No suspicious renal lesion, urolithiasis or hydronephrosis. Urinary bladder is unremarkable for degree of distension. Stomach/Bowel: Distal esophagus, stomach and duodenum  are unremarkable. No small bowel thickening or dilatation. Normal appendix in the right lower quadrant. Proximal colon is fairly bland in appearance. There is focal thickening of the proximal sigmoid and distal descending colon centered upon a culprit diverticula in the left lower quadrant (2/64) with some adjacent areas of punctate extraluminal gas and associated phlegmon with thickening of the adjacent peritoneum concerning for a perforated diverticulitis no large spillage of bowel contents is seen. No organized collection or abscess. No resulting obstruction. Vascular/Lymphatic: No significant vascular findings are present. No pathologically enlarged abdominal or pelvic lymph nodes. Reactive adenopathy seen in the mesentery and left lower quadrant. Reproductive: The prostate and seminal vesicles are unremarkable. Other: Phlegmon and inflammation with small amount of extraluminal gas centered upon the culprit diverticulum of the proximal sigmoid in the left lower quadrant. Associated adjacent features of peritonitis as well. No organized abscess or collection. No bowel containing hernia. Tiny fat containing umbilical hernia. Musculoskeletal: No acute osseous abnormality or suspicious osseous lesion. IMPRESSION: 1. Acute perforated diverticulitis of the proximal sigmoid/distal descending colon centered upon a culprit diverticulum in the left lower quadrant with small amount of extraluminal gas, associated phlegmon with thickening of the adjacent peritoneum to suggest features associated peritonitis. No organized abscess or collection. No large spillage of bowel contents, organized abscess or collection is seen. 2. Hepatic steatosis. Electronically Signed   By: Lovena Le M.D.   On: 11/22/2020 22:43    Procedures .Critical Care Performed by: Jacqlyn Larsen, PA-C Authorized by: Jacqlyn Larsen, PA-C   Critical care provider statement:    Critical care time (minutes):  45   Critical care was necessary to  treat or prevent imminent or life-threatening deterioration of the following conditions: Perforated diverticulitis.   Critical care was time spent personally by me on the following activities:  Discussions with consultants, evaluation of patient's response to treatment, examination of patient, ordering and performing treatments and interventions, ordering and review of laboratory studies, ordering and review of radiographic studies, pulse oximetry, re-evaluation of patient's condition, obtaining history from patient or surrogate and review of old charts   {Remember to document critical care time when appropriate:1}  Medications Ordered in ED Medications  piperacillin-tazobactam (ZOSYN) IVPB 3.375 g (has no administration in time range)  ondansetron (ZOFRAN) injection 4 mg (4 mg Intravenous Given 11/22/20 2057)  sodium chloride 0.9 % bolus 1,000 mL (0 mLs Intravenous Stopped 11/22/20 2330)  morphine 4 MG/ML injection 4 mg (4 mg Intravenous Given 11/22/20 2059)  iohexol (OMNIPAQUE) 300 MG/ML solution 100 mL (100 mLs Intravenous Contrast Given 11/22/20 2224)    ED Course  I have reviewed the triage vital signs and the nursing notes.  Pertinent labs & imaging results that were available during my care of the patient were reviewed by me and considered in my medical decision making (see chart for details).    MDM Rules/Calculators/A&P                          ***  Final Clinical Impression(s) / ED Diagnoses Final diagnoses:  Diverticulitis of colon with perforation    Rx / DC Orders ED Discharge Orders    None

## 2020-11-23 ENCOUNTER — Encounter (HOSPITAL_COMMUNITY): Payer: Self-pay

## 2020-11-23 DIAGNOSIS — E119 Type 2 diabetes mellitus without complications: Secondary | ICD-10-CM | POA: Diagnosis present

## 2020-11-23 DIAGNOSIS — Z79899 Other long term (current) drug therapy: Secondary | ICD-10-CM | POA: Diagnosis not present

## 2020-11-23 DIAGNOSIS — K5780 Diverticulitis of intestine, part unspecified, with perforation and abscess without bleeding: Secondary | ICD-10-CM | POA: Diagnosis present

## 2020-11-23 DIAGNOSIS — K5720 Diverticulitis of large intestine with perforation and abscess without bleeding: Secondary | ICD-10-CM | POA: Diagnosis present

## 2020-11-23 DIAGNOSIS — Z20822 Contact with and (suspected) exposure to covid-19: Secondary | ICD-10-CM | POA: Diagnosis present

## 2020-11-23 DIAGNOSIS — E669 Obesity, unspecified: Secondary | ICD-10-CM | POA: Diagnosis present

## 2020-11-23 DIAGNOSIS — R1032 Left lower quadrant pain: Secondary | ICD-10-CM | POA: Diagnosis present

## 2020-11-23 DIAGNOSIS — Z7984 Long term (current) use of oral hypoglycemic drugs: Secondary | ICD-10-CM | POA: Diagnosis not present

## 2020-11-23 DIAGNOSIS — Z6841 Body Mass Index (BMI) 40.0 and over, adult: Secondary | ICD-10-CM | POA: Diagnosis not present

## 2020-11-23 LAB — BASIC METABOLIC PANEL
Anion gap: 9 (ref 5–15)
BUN: 9 mg/dL (ref 6–20)
CO2: 19 mmol/L — ABNORMAL LOW (ref 22–32)
Calcium: 8.1 mg/dL — ABNORMAL LOW (ref 8.9–10.3)
Chloride: 108 mmol/L (ref 98–111)
Creatinine, Ser: 0.66 mg/dL (ref 0.61–1.24)
GFR, Estimated: >60 mL/min (ref >60)
Glucose, Bld: 162 mg/dL — ABNORMAL HIGH (ref 70–99)
Potassium: 3.3 mmol/L — ABNORMAL LOW (ref 3.5–5.1)
Sodium: 136 mmol/L (ref 135–145)

## 2020-11-23 LAB — CBC
HCT: 41 % (ref 39.0–52.0)
Hemoglobin: 14 g/dL (ref 13.0–17.0)
MCH: 29 pg (ref 26.0–34.0)
MCHC: 34.1 g/dL (ref 30.0–36.0)
MCV: 84.9 fL (ref 80.0–100.0)
Platelets: 245 10*3/uL (ref 150–400)
RBC: 4.83 MIL/uL (ref 4.22–5.81)
RDW: 13.2 % (ref 11.5–15.5)
WBC: 11.7 10*3/uL — ABNORMAL HIGH (ref 4.0–10.5)
nRBC: 0 % (ref 0.0–0.2)

## 2020-11-23 LAB — GLUCOSE, CAPILLARY
Glucose-Capillary: 142 mg/dL — ABNORMAL HIGH (ref 70–99)
Glucose-Capillary: 156 mg/dL — ABNORMAL HIGH (ref 70–99)
Glucose-Capillary: 253 mg/dL — ABNORMAL HIGH (ref 70–99)

## 2020-11-23 LAB — RESP PANEL BY RT-PCR (FLU A&B, COVID) ARPGX2
Influenza A by PCR: NEGATIVE
Influenza B by PCR: NEGATIVE
SARS Coronavirus 2 by RT PCR: NEGATIVE

## 2020-11-23 LAB — HIV ANTIBODY (ROUTINE TESTING W REFLEX): HIV Screen 4th Generation wRfx: NONREACTIVE

## 2020-11-23 MED ORDER — DIPHENHYDRAMINE HCL 12.5 MG/5ML PO ELIX: 12.5000 mg | ORAL_SOLUTION | Freq: Four times a day (QID) | ORAL | Status: DC | PRN

## 2020-11-23 MED ORDER — ACETAMINOPHEN 500 MG PO TABS
1000.0000 mg | ORAL_TABLET | Freq: Once | ORAL | Status: AC
  Administered 2020-11-23: 1000 mg via ORAL
  Filled 2020-11-23: qty 2

## 2020-11-23 MED ORDER — ENOXAPARIN SODIUM 40 MG/0.4ML ~~LOC~~ SOLN
40.0000 mg | SUBCUTANEOUS | Status: DC
  Administered 2020-11-23 – 2020-11-24 (×2): 40 mg via SUBCUTANEOUS
  Filled 2020-11-23 (×2): qty 0.4

## 2020-11-23 MED ORDER — SODIUM CHLORIDE 0.9 % IV SOLN
2.0000 g | INTRAVENOUS | Status: DC
  Administered 2020-11-23 – 2020-11-25 (×3): 2 g via INTRAVENOUS
  Filled 2020-11-23: qty 20
  Filled 2020-11-23 (×3): qty 2

## 2020-11-23 MED ORDER — INSULIN ASPART 100 UNIT/ML ~~LOC~~ SOLN
0.0000 [IU] | Freq: Every day | SUBCUTANEOUS | Status: DC
  Administered 2020-11-24: 2 [IU] via SUBCUTANEOUS

## 2020-11-23 MED ORDER — ONDANSETRON HCL 4 MG/2ML IJ SOLN
4.0000 mg | Freq: Four times a day (QID) | INTRAMUSCULAR | Status: DC | PRN
  Administered 2020-11-23: 4 mg via INTRAVENOUS
  Filled 2020-11-23: qty 2

## 2020-11-23 MED ORDER — ONDANSETRON 4 MG PO TBDP: 4.0000 mg | ORAL_TABLET | Freq: Four times a day (QID) | ORAL | Status: DC | PRN

## 2020-11-23 MED ORDER — ACETAMINOPHEN 325 MG PO TABS
650.0000 mg | ORAL_TABLET | Freq: Four times a day (QID) | ORAL | Status: DC | PRN
  Administered 2020-11-23 (×2): 650 mg via ORAL
  Filled 2020-11-23 (×2): qty 2

## 2020-11-23 MED ORDER — DIPHENHYDRAMINE HCL 50 MG/ML IJ SOLN: 12.5000 mg | Freq: Four times a day (QID) | INTRAMUSCULAR | Status: DC | PRN

## 2020-11-23 MED ORDER — POTASSIUM CHLORIDE IN NACL 20-0.45 MEQ/L-% IV SOLN
INTRAVENOUS | Status: DC
  Filled 2020-11-23 (×2): qty 1000

## 2020-11-23 MED ORDER — SIMETHICONE 80 MG PO CHEW
40.0000 mg | CHEWABLE_TABLET | Freq: Four times a day (QID) | ORAL | Status: DC | PRN
  Administered 2020-11-24: 40 mg via ORAL
  Filled 2020-11-23 (×2): qty 1

## 2020-11-23 MED ORDER — MORPHINE SULFATE (PF) 2 MG/ML IV SOLN
2.0000 mg | INTRAVENOUS | Status: DC | PRN
  Administered 2020-11-23: 2 mg via INTRAVENOUS
  Filled 2020-11-23: qty 1

## 2020-11-23 MED ORDER — METRONIDAZOLE IN NACL 5-0.79 MG/ML-% IV SOLN: 500.0000 mg | Freq: Three times a day (TID) | INTRAVENOUS | Status: DC

## 2020-11-23 MED ORDER — INSULIN ASPART 100 UNIT/ML ~~LOC~~ SOLN
0.0000 [IU] | Freq: Three times a day (TID) | SUBCUTANEOUS | Status: DC
  Administered 2020-11-23: 11 [IU] via SUBCUTANEOUS
  Administered 2020-11-24: 4 [IU] via SUBCUTANEOUS
  Administered 2020-11-24: 7 [IU] via SUBCUTANEOUS
  Administered 2020-11-24 – 2020-11-25 (×2): 4 [IU] via SUBCUTANEOUS

## 2020-11-23 MED ORDER — SODIUM CHLORIDE 0.9 % IV SOLN: 2.0000 g | INTRAVENOUS | Status: DC

## 2020-11-23 MED ORDER — SODIUM CHLORIDE 0.9 % IV BOLUS
1000.0000 mL | Freq: Once | INTRAVENOUS | Status: AC
  Administered 2020-11-23: 1000 mL via INTRAVENOUS

## 2020-11-23 MED ORDER — ACETAMINOPHEN 650 MG RE SUPP: 650.0000 mg | Freq: Four times a day (QID) | RECTAL | Status: DC | PRN

## 2020-11-23 MED ORDER — METRONIDAZOLE IN NACL 5-0.79 MG/ML-% IV SOLN
500.0000 mg | Freq: Three times a day (TID) | INTRAVENOUS | Status: DC
  Administered 2020-11-23 – 2020-11-25 (×7): 500 mg via INTRAVENOUS
  Filled 2020-11-23 (×7): qty 100

## 2020-11-23 NOTE — Progress Notes (Signed)
Subjective/Chief Complaint: Feels better. No complaints   Objective: Vital signs in last 24 hours: Temp:  [99.4 F (37.4 C)-101.3 F (38.5 C)] 99.8 F (37.7 C) (04/15 0502) Pulse Rate:  [87-112] 96 (04/15 0502) Resp:  [15-30] 16 (04/15 0502) BP: (119-183)/(78-108) 119/79 (04/15 0502) SpO2:  [91 %-100 %] 96 % (04/15 0502) Weight:  [127.2 kg] 127.2 kg (04/15 0306) Last BM Date: 11/22/20  Intake/Output from previous day: 04/14 0701 - 04/15 0700 In: 2296.7 [P.O.:60; I.V.:141.7; IV Piggyback:2095.1] Out: -  Intake/Output this shift: No intake/output data recorded.  General appearance: alert and cooperative Resp: clear to auscultation bilaterally Cardio: regular rate and rhythm GI: soft, minimal tenderness  Lab Results:  Recent Labs    11/22/20 2100 11/23/20 0553  WBC 9.9 11.7*  HGB 16.4 14.0  HCT 48.3 41.0  PLT 275 245   BMET Recent Labs    11/22/20 2100 11/23/20 0553  NA 135 136  K 3.8 3.3*  CL 102 108  CO2 23 19*  GLUCOSE 186* 162*  BUN 13 9  CREATININE 0.77 0.66  CALCIUM 9.3 8.1*   PT/INR No results for input(s): LABPROT, INR in the last 72 hours. ABG No results for input(s): PHART, HCO3 in the last 72 hours.  Invalid input(s): PCO2, PO2  Studies/Results: CT Abdomen Pelvis W Contrast  Result Date: 11/22/2020 CLINICAL DATA:  Left quadrant abdominal pain EXAM: CT ABDOMEN AND PELVIS WITH CONTRAST TECHNIQUE: Multidetector CT imaging of the abdomen and pelvis was performed using the standard protocol following bolus administration of intravenous contrast. CONTRAST:  115mL OMNIPAQUE IOHEXOL 300 MG/ML  SOLN COMPARISON:  None. FINDINGS: Lower chest: Lung bases are clear. Normal heart size. No pericardial effusion. Hepatobiliary: Diffuse hepatic hypoattenuation compatible with hepatic steatosis. Sparing seen gallbladder fossa. No concerning focal liver lesion. Smooth liver surface contour. Normal gallbladder and biliary tree without visible calcified  gallstone or biliary dilatation. Pancreas: No pancreatic ductal dilatation or surrounding inflammatory changes. Spleen: Normal in size. No concerning splenic lesions. Adrenals/Urinary Tract: Normal adrenal glands. Kidneys are normally located with symmetric enhancement and excretion. No suspicious renal lesion, urolithiasis or hydronephrosis. Urinary bladder is unremarkable for degree of distension. Stomach/Bowel: Distal esophagus, stomach and duodenum are unremarkable. No small bowel thickening or dilatation. Normal appendix in the right lower quadrant. Proximal colon is fairly bland in appearance. There is focal thickening of the proximal sigmoid and distal descending colon centered upon a culprit diverticula in the left lower quadrant (2/64) with some adjacent areas of punctate extraluminal gas and associated phlegmon with thickening of the adjacent peritoneum concerning for a perforated diverticulitis no large spillage of bowel contents is seen. No organized collection or abscess. No resulting obstruction. Vascular/Lymphatic: No significant vascular findings are present. No pathologically enlarged abdominal or pelvic lymph nodes. Reactive adenopathy seen in the mesentery and left lower quadrant. Reproductive: The prostate and seminal vesicles are unremarkable. Other: Phlegmon and inflammation with small amount of extraluminal gas centered upon the culprit diverticulum of the proximal sigmoid in the left lower quadrant. Associated adjacent features of peritonitis as well. No organized abscess or collection. No bowel containing hernia. Tiny fat containing umbilical hernia. Musculoskeletal: No acute osseous abnormality or suspicious osseous lesion. IMPRESSION: 1. Acute perforated diverticulitis of the proximal sigmoid/distal descending colon centered upon a culprit diverticulum in the left lower quadrant with small amount of extraluminal gas, associated phlegmon with thickening of the adjacent peritoneum to suggest  features associated peritonitis. No organized abscess or collection. No large spillage of bowel contents, organized  abscess or collection is seen. 2. Hepatic steatosis. Electronically Signed   By: Lovena Le M.D.   On: 11/22/2020 22:43    Anti-infectives: Anti-infectives (From admission, onward)   Start     Dose/Rate Route Frequency Ordered Stop   11/23/20 0600  cefTRIAXone (ROCEPHIN) 2 g in sodium chloride 0.9 % 100 mL IVPB       "And" Linked Group Details   2 g 200 mL/hr over 30 Minutes Intravenous Every 24 hours 11/23/20 0359     11/23/20 0600  metroNIDAZOLE (FLAGYL) IVPB 500 mg       "And" Linked Group Details   500 mg 100 mL/hr over 60 Minutes Intravenous Every 8 hours 11/23/20 0359     11/23/20 0356  cefTRIAXone (ROCEPHIN) 2 g in sodium chloride 0.9 % 100 mL IVPB  Status:  Discontinued       "And" Linked Group Details   2 g 200 mL/hr over 30 Minutes Intravenous Every 24 hours 11/23/20 0356 11/23/20 0358   11/23/20 0356  metroNIDAZOLE (FLAGYL) IVPB 500 mg  Status:  Discontinued       "And" Linked Group Details   500 mg 100 mL/hr over 60 Minutes Intravenous Every 8 hours 11/23/20 0356 11/23/20 0358   11/22/20 2300  piperacillin-tazobactam (ZOSYN) IVPB 3.375 g        3.375 g 100 mL/hr over 30 Minutes Intravenous  Once 11/22/20 2256 11/23/20 0040      Assessment/Plan: s/p * No surgery found * Advance diet. Allow clears Continue IV rocephin and flagyl ambulate  LOS: 0 days    Brad Nash 11/23/2020

## 2020-11-24 LAB — CBC
HCT: 42.5 % (ref 39.0–52.0)
Hemoglobin: 14.3 g/dL (ref 13.0–17.0)
MCH: 29 pg (ref 26.0–34.0)
MCHC: 33.6 g/dL (ref 30.0–36.0)
MCV: 86.2 fL (ref 80.0–100.0)
Platelets: 230 10*3/uL (ref 150–400)
RBC: 4.93 MIL/uL (ref 4.22–5.81)
RDW: 13.2 % (ref 11.5–15.5)
WBC: 11.1 10*3/uL — ABNORMAL HIGH (ref 4.0–10.5)
nRBC: 0 % (ref 0.0–0.2)

## 2020-11-24 LAB — GLUCOSE, CAPILLARY
Glucose-Capillary: 232 mg/dL — ABNORMAL HIGH (ref 70–99)
Glucose-Capillary: 165 mg/dL — ABNORMAL HIGH (ref 70–99)
Glucose-Capillary: 214 mg/dL — ABNORMAL HIGH (ref 70–99)

## 2020-11-24 NOTE — Progress Notes (Signed)
Doing abd assessment this am, noted large purple/red bruising on pt's RLQ measuring approx 8 in by 4 inches, with a long, red horizontal mark in the center of it. Pt stated that he has not bruised or bumped this area and is not aware of why/when this happened.

## 2020-11-24 NOTE — Progress Notes (Signed)
Subjective/Chief Complaint: Feels better. Min pain   Objective: Vital signs in last 24 hours: Temp:  [98.3 F (36.8 C)-100.2 F (37.9 C)] 98.3 F (36.8 C) (04/16 0524) Pulse Rate:  [80-87] 81 (04/16 0524) Resp:  [18-19] 18 (04/16 0524) BP: (126-147)/(78-98) 147/98 (04/16 0524) SpO2:  [95 %-100 %] 100 % (04/16 0524) Last BM Date: 11/23/20  Intake/Output from previous day: 04/15 0701 - 04/16 0700 In: 1915 [P.O.:840; I.V.:775; IV Piggyback:300] Out: 2 [Urine:2] Intake/Output this shift: No intake/output data recorded.  General appearance: alert and cooperative Resp: clear to auscultation bilaterally Cardio: regular rate and rhythm GI: soft, minimal tenderness  Lab Results:  Recent Labs    11/23/20 0553 11/24/20 0427  WBC 11.7* 11.1*  HGB 14.0 14.3  HCT 41.0 42.5  PLT 245 230   BMET Recent Labs    11/22/20 2100 11/23/20 0553  NA 135 136  K 3.8 3.3*  CL 102 108  CO2 23 19*  GLUCOSE 186* 162*  BUN 13 9  CREATININE 0.77 0.66  CALCIUM 9.3 8.1*   PT/INR No results for input(s): LABPROT, INR in the last 72 hours. ABG No results for input(s): PHART, HCO3 in the last 72 hours.  Invalid input(s): PCO2, PO2  Studies/Results: CT Abdomen Pelvis W Contrast  Result Date: 11/22/2020 CLINICAL DATA:  Left quadrant abdominal pain EXAM: CT ABDOMEN AND PELVIS WITH CONTRAST TECHNIQUE: Multidetector CT imaging of the abdomen and pelvis was performed using the standard protocol following bolus administration of intravenous contrast. CONTRAST:  188mL OMNIPAQUE IOHEXOL 300 MG/ML  SOLN COMPARISON:  None. FINDINGS: Lower chest: Lung bases are clear. Normal heart size. No pericardial effusion. Hepatobiliary: Diffuse hepatic hypoattenuation compatible with hepatic steatosis. Sparing seen gallbladder fossa. No concerning focal liver lesion. Smooth liver surface contour. Normal gallbladder and biliary tree without visible calcified gallstone or biliary dilatation. Pancreas: No  pancreatic ductal dilatation or surrounding inflammatory changes. Spleen: Normal in size. No concerning splenic lesions. Adrenals/Urinary Tract: Normal adrenal glands. Kidneys are normally located with symmetric enhancement and excretion. No suspicious renal lesion, urolithiasis or hydronephrosis. Urinary bladder is unremarkable for degree of distension. Stomach/Bowel: Distal esophagus, stomach and duodenum are unremarkable. No small bowel thickening or dilatation. Normal appendix in the right lower quadrant. Proximal colon is fairly bland in appearance. There is focal thickening of the proximal sigmoid and distal descending colon centered upon a culprit diverticula in the left lower quadrant (2/64) with some adjacent areas of punctate extraluminal gas and associated phlegmon with thickening of the adjacent peritoneum concerning for a perforated diverticulitis no large spillage of bowel contents is seen. No organized collection or abscess. No resulting obstruction. Vascular/Lymphatic: No significant vascular findings are present. No pathologically enlarged abdominal or pelvic lymph nodes. Reactive adenopathy seen in the mesentery and left lower quadrant. Reproductive: The prostate and seminal vesicles are unremarkable. Other: Phlegmon and inflammation with small amount of extraluminal gas centered upon the culprit diverticulum of the proximal sigmoid in the left lower quadrant. Associated adjacent features of peritonitis as well. No organized abscess or collection. No bowel containing hernia. Tiny fat containing umbilical hernia. Musculoskeletal: No acute osseous abnormality or suspicious osseous lesion. IMPRESSION: 1. Acute perforated diverticulitis of the proximal sigmoid/distal descending colon centered upon a culprit diverticulum in the left lower quadrant with small amount of extraluminal gas, associated phlegmon with thickening of the adjacent peritoneum to suggest features associated peritonitis. No organized  abscess or collection. No large spillage of bowel contents, organized abscess or collection is seen. 2. Hepatic steatosis.  Electronically Signed   By: Lovena Le M.D.   On: 11/22/2020 22:43    Anti-infectives: Anti-infectives (From admission, onward)   Start     Dose/Rate Route Frequency Ordered Stop   11/23/20 0600  cefTRIAXone (ROCEPHIN) 2 g in sodium chloride 0.9 % 100 mL IVPB       "And" Linked Group Details   2 g 200 mL/hr over 30 Minutes Intravenous Every 24 hours 11/23/20 0359     11/23/20 0600  metroNIDAZOLE (FLAGYL) IVPB 500 mg       "And" Linked Group Details   500 mg 100 mL/hr over 60 Minutes Intravenous Every 8 hours 11/23/20 0359     11/23/20 0356  cefTRIAXone (ROCEPHIN) 2 g in sodium chloride 0.9 % 100 mL IVPB  Status:  Discontinued       "And" Linked Group Details   2 g 200 mL/hr over 30 Minutes Intravenous Every 24 hours 11/23/20 0356 11/23/20 0358   11/23/20 0356  metroNIDAZOLE (FLAGYL) IVPB 500 mg  Status:  Discontinued       "And" Linked Group Details   500 mg 100 mL/hr over 60 Minutes Intravenous Every 8 hours 11/23/20 0356 11/23/20 0358   11/22/20 2300  piperacillin-tazobactam (ZOSYN) IVPB 3.375 g        3.375 g 100 mL/hr over 30 Minutes Intravenous  Once 11/22/20 2256 11/23/20 0040      Assessment/Plan: Focal diverticulitis with perforation DM 2  Advance diet. Soft diet today Continue IV rocephin and flagyl Ambulate Possible d/c tom  LOS: 1 day    Rosario Adie 0/98/1191

## 2020-11-25 LAB — CBC
HCT: 38.8 % — ABNORMAL LOW (ref 39.0–52.0)
Hemoglobin: 13.5 g/dL (ref 13.0–17.0)
MCH: 28.7 pg (ref 26.0–34.0)
MCHC: 34.8 g/dL (ref 30.0–36.0)
MCV: 82.6 fL (ref 80.0–100.0)
Platelets: 256 10*3/uL (ref 150–400)
RBC: 4.7 MIL/uL (ref 4.22–5.81)
RDW: 12.9 % (ref 11.5–15.5)
WBC: 9.2 10*3/uL (ref 4.0–10.5)
nRBC: 0 % (ref 0.0–0.2)

## 2020-11-25 LAB — GLUCOSE, CAPILLARY: Glucose-Capillary: 154 mg/dL — ABNORMAL HIGH (ref 70–99)

## 2020-11-25 MED ORDER — AMOXICILLIN-POT CLAVULANATE 875-125 MG PO TABS: 1.0000 | ORAL_TABLET | Freq: Two times a day (BID) | ORAL | 0 refills | Status: DC

## 2020-11-25 MED ORDER — AMOXICILLIN-POT CLAVULANATE 875-125 MG PO TABS
1.0000 | ORAL_TABLET | Freq: Two times a day (BID) | ORAL | Status: DC
  Administered 2020-11-25: 1 via ORAL
  Filled 2020-11-25: qty 1

## 2020-11-25 NOTE — Progress Notes (Signed)
Reviewed written d/c instructions w pt and all questions answered. Pt verbalized understanding. D/c per w/c w all belongings in stable condition.

## 2020-11-25 NOTE — Discharge Summary (Signed)
Physician Discharge Summary  Patient ID: Brad Nash MRN: [id number] DOB/AGE: 12-24-1980 40 y.o.  Admit date: 11/22/2020 Discharge date: 11/25/2020  Admission Diagnoses: Perforated diverticulitis  Discharge Diagnoses:  Active Problems:   Perforated diverticulum   Discharged Condition: good  Hospital Course: Patient was admitted to the hospital after being diagnosed with diverticulitis with microperforation.  He was placed on IV antibiotics.  His symptoms resolved quickly.  His diet was advanced.  By hospital day 3 he was tolerating a diet and having good bowel function.  He was having no pain and his white blood cell count had returned to normal.  He will continue antibiotics and a low residue diet for the next week.  Consults: None  Significant Diagnostic Studies: labs: cbc. bmet  Treatments: IV hydration and antibiotics: ceftriaxone and metronidazole  Discharge Exam: Blood pressure (!) 142/79, pulse 66, temperature 98.6 F (37 C), resp. rate 16, height 5\' 6"  (1.676 m), weight 116.6 kg, SpO2 99 %. General appearance: alert and cooperative Abd: soft, NT to palp  Disposition: home   Allergies as of 11/25/2020   No Known Allergies     Medication List    TAKE these medications   amoxicillin-clavulanate 875-125 MG tablet Commonly known as: AUGMENTIN Take 1 tablet by mouth every 12 (twelve) hours.   ELDERBERRY PO Take 1 capsule by mouth daily.   FISH OIL PO Take 1 capsule by mouth daily.   gemfibrozil 600 MG tablet Commonly known as: LOPID Take 600 mg by mouth 2 (two) times daily.   metFORMIN 500 MG tablet Commonly known as: GLUCOPHAGE Take 500 mg by mouth in the morning and at bedtime.   multivitamin with minerals Tabs tablet Take 1 tablet by mouth daily.       Follow-up Information    Leighton Ruff, MD. Schedule an appointment as soon as possible for a visit in 2 week(s).   Specialties: General Surgery, Colon and Rectal Surgery Contact information: [place] [address] [phone]               Signed: Rosario Adie 5/88/3254, 8:31 AM

## 2020-11-25 NOTE — Discharge Instructions (Signed)
Diverticulitis  Diverticulitis is when small pouches in your colon (large intestine) get infected or swollen. This causes pain in the belly (abdomen) and watery poop (diarrhea). These pouches are called diverticula. The pouches form in people who have a condition called diverticulosis. What are the causes? This condition may be caused by poop (stool) that gets trapped in the pouches in your colon. The poop lets germs (bacteria) grow in the pouches. This causes the infection. What increases the risk? You are more likely to get this condition if you have small pouches in your colon. The risk is higher if:  You are overweight or very overweight (obese).  You do not exercise enough.  You drink alcohol.  You smoke or use products with tobacco in them.  You eat a diet that has a lot of red meat such as beef, pork, or lamb.  You eat a diet that does not have enough fiber in it.  You are older than 40 years of age. What are the signs or symptoms?  Pain in the belly. Pain is often on the left side, but it may be in other areas.  Fever and feeling cold.  Feeling like you may vomit.  Vomiting.  Having cramps.  Feeling full.  Changes to how often you poop.  Blood in your poop. How is this treated? Most cases are treated at home by:  Taking over-the-counter pain medicines.  Following a clear liquid diet.  Taking antibiotic medicines.  Resting. Very bad cases may need to be treated at a hospital. This may include:  Not eating or drinking.  Taking prescription pain medicine.  Getting antibiotic medicines through an IV tube.  Getting fluid and food through an IV tube.  Having surgery. When you are feeling better, your doctor may tell you to have a test to check your colon (colonoscopy). Follow these instructions at home: Medicines  Take over-the-counter and prescription medicines only as told by your doctor. These include: ? Antibiotics. ? Pain medicines. ? Fiber  pills. ? Probiotics. ? Stool softeners.  If you were prescribed an antibiotic medicine, take it as told by your doctor. Do not stop taking the antibiotic even if you start to feel better.  Ask your doctor if the medicine prescribed to you requires you to avoid driving or using machinery. Eating and drinking  Follow a diet as told by your doctor.  When you feel better, your doctor may tell you to change your diet. You may need to eat a lot of fiber. Fiber makes it easier to poop (have a bowel movement). Foods with fiber include: ? Berries. ? Beans. ? Lentils. ? Green vegetables.  Avoid eating red meat.   General instructions  Do not use any products that contain nicotine or tobacco, such as cigarettes, e-cigarettes, and chewing tobacco. If you need help quitting, ask your doctor.  Exercise 3 or more times a week. Try to get 30 minutes each time. Exercise enough to sweat and make your heart beat faster.  Keep all follow-up visits as told by your doctor. This is important. Contact a doctor if:  Your pain does not get better.  You are not pooping like normal. Get help right away if:  Your pain gets worse.  Your symptoms do not get better.  Your symptoms get worse very fast.  You have a fever.  You vomit more than one time.  You have poop that is: ? Bloody. ? Black. ? Tarry. Summary  This condition happens when   small pouches in your colon get infected or swollen.  Take medicines only as told by your doctor.  Follow a diet as told by your doctor.  Keep all follow-up visits as told by your doctor. This is important. This information is not intended to replace advice given to you by your health care provider. Make sure you discuss any questions you have with your health care provider. Document Revised: 05/09/2019 Document Reviewed: 05/09/2019 [employer] Patient Education  2021 [employer]  

## 2020-11-26 ENCOUNTER — Telehealth: Payer: Self-pay | Admitting: General Practice

## 2020-11-26 NOTE — Telephone Encounter (Signed)
error 

## 2020-12-17 ENCOUNTER — Inpatient Hospital Stay (HOSPITAL_COMMUNITY)
Admission: EM | Admit: 2020-12-17 | Discharge: 2020-12-19 | DRG: 392 | Disposition: A | Discharge status: Home / Self Care | Payer: 59 | Attending: General Surgery | Admitting: General Surgery

## 2020-12-17 ENCOUNTER — Other Ambulatory Visit: Payer: Self-pay

## 2020-12-17 ENCOUNTER — Encounter (HOSPITAL_COMMUNITY): Payer: Self-pay

## 2020-12-17 ENCOUNTER — Emergency Department (HOSPITAL_COMMUNITY): Payer: 59

## 2020-12-17 DIAGNOSIS — K5732 Diverticulitis of large intestine without perforation or abscess without bleeding: Secondary | ICD-10-CM | POA: Diagnosis not present

## 2020-12-17 DIAGNOSIS — Z79899 Other long term (current) drug therapy: Secondary | ICD-10-CM | POA: Diagnosis not present

## 2020-12-17 DIAGNOSIS — Z7984 Long term (current) use of oral hypoglycemic drugs: Secondary | ICD-10-CM

## 2020-12-17 DIAGNOSIS — Z20822 Contact with and (suspected) exposure to covid-19: Secondary | ICD-10-CM | POA: Diagnosis present

## 2020-12-17 DIAGNOSIS — E119 Type 2 diabetes mellitus without complications: Secondary | ICD-10-CM | POA: Diagnosis present

## 2020-12-17 DIAGNOSIS — E669 Obesity, unspecified: Secondary | ICD-10-CM | POA: Diagnosis present

## 2020-12-17 DIAGNOSIS — Z91013 Allergy to seafood: Secondary | ICD-10-CM | POA: Diagnosis not present

## 2020-12-17 DIAGNOSIS — K5792 Diverticulitis of intestine, part unspecified, without perforation or abscess without bleeding: Secondary | ICD-10-CM | POA: Diagnosis not present

## 2020-12-17 DIAGNOSIS — Z6838 Body mass index (BMI) 38.0-38.9, adult: Secondary | ICD-10-CM | POA: Diagnosis not present

## 2020-12-17 DIAGNOSIS — K5900 Constipation, unspecified: Secondary | ICD-10-CM | POA: Diagnosis present

## 2020-12-17 HISTORY — DX: Type 2 diabetes mellitus without complications: E11.9

## 2020-12-17 HISTORY — DX: Diverticulosis of intestine, part unspecified, without perforation or abscess without bleeding: K57.90

## 2020-12-17 LAB — URINALYSIS, ROUTINE W REFLEX MICROSCOPIC
Bilirubin Urine: NEGATIVE
Glucose, UA: NEGATIVE mg/dL
Hgb urine dipstick: NEGATIVE
Ketones, ur: NEGATIVE mg/dL
Leukocytes,Ua: NEGATIVE
Nitrite: NEGATIVE
Protein, ur: 100 mg/dL — AB
Specific Gravity, Urine: 1.028 (ref 1.005–1.030)
pH: 5 (ref 5.0–8.0)

## 2020-12-17 LAB — CBC
HCT: 43.4 % (ref 39.0–52.0)
Hemoglobin: 15.1 g/dL (ref 13.0–17.0)
MCH: 28.8 pg (ref 26.0–34.0)
MCHC: 34.8 g/dL (ref 30.0–36.0)
MCV: 82.7 fL (ref 80.0–100.0)
Platelets: 274 10*3/uL (ref 150–400)
RBC: 5.25 MIL/uL (ref 4.22–5.81)
RDW: 13.6 % (ref 11.5–15.5)
WBC: 13.1 10*3/uL — ABNORMAL HIGH (ref 4.0–10.5)
nRBC: 0 % (ref 0.0–0.2)

## 2020-12-17 LAB — COMPREHENSIVE METABOLIC PANEL
ALT: 38 U/L (ref 0–44)
AST: 23 U/L (ref 15–41)
Albumin: 4.1 g/dL (ref 3.5–5.0)
Alkaline Phosphatase: 47 U/L (ref 38–126)
Anion gap: 9 (ref 5–15)
BUN: 15 mg/dL (ref 6–20)
CO2: 21 mmol/L — ABNORMAL LOW (ref 22–32)
Calcium: 9.1 mg/dL (ref 8.9–10.3)
Chloride: 107 mmol/L (ref 98–111)
Creatinine, Ser: 0.73 mg/dL (ref 0.61–1.24)
GFR, Estimated: >60 mL/min (ref >60)
Glucose, Bld: 175 mg/dL — ABNORMAL HIGH (ref 70–99)
Potassium: 3.9 mmol/L (ref 3.5–5.1)
Sodium: 137 mmol/L (ref 135–145)
Total Bilirubin: 0.5 mg/dL (ref 0.3–1.2)
Total Protein: 7.6 g/dL (ref 6.5–8.1)

## 2020-12-17 LAB — HEMOGLOBIN A1C
Hgb A1c MFr Bld: 8.3 % — ABNORMAL HIGH (ref 4.8–5.6)
Mean Plasma Glucose: 191.51 mg/dL

## 2020-12-17 LAB — SARS CORONAVIRUS 2 (TAT 6-24 HRS): SARS Coronavirus 2: NEGATIVE

## 2020-12-17 LAB — LIPASE, BLOOD: Lipase: 36 U/L (ref 11–51)

## 2020-12-17 LAB — GLUCOSE, CAPILLARY
Glucose-Capillary: 107 mg/dL — ABNORMAL HIGH (ref 70–99)
Glucose-Capillary: 113 mg/dL — ABNORMAL HIGH (ref 70–99)
Glucose-Capillary: 119 mg/dL — ABNORMAL HIGH (ref 70–99)

## 2020-12-17 LAB — CBG MONITORING, ED: Glucose-Capillary: 142 mg/dL — ABNORMAL HIGH (ref 70–99)

## 2020-12-17 MED ORDER — DIPHENHYDRAMINE HCL 50 MG/ML IJ SOLN: 25.0000 mg | Freq: Four times a day (QID) | INTRAMUSCULAR | Status: DC | PRN

## 2020-12-17 MED ORDER — SODIUM CHLORIDE 0.9 % IV BOLUS
1000.0000 mL | Freq: Once | INTRAVENOUS | Status: AC
  Administered 2020-12-17: 1000 mL via INTRAVENOUS

## 2020-12-17 MED ORDER — POLYETHYLENE GLYCOL 3350 17 G PO PACK
17.0000 g | PACK | Freq: Every day | ORAL | Status: DC
  Administered 2020-12-17 – 2020-12-18 (×2): 17 g via ORAL
  Filled 2020-12-17: qty 1

## 2020-12-17 MED ORDER — ENOXAPARIN SODIUM 40 MG/0.4ML IJ SOSY
40.0000 mg | PREFILLED_SYRINGE | INTRAMUSCULAR | Status: DC
  Administered 2020-12-17 – 2020-12-18 (×2): 40 mg via SUBCUTANEOUS
  Filled 2020-12-17 (×2): qty 0.4

## 2020-12-17 MED ORDER — DIPHENHYDRAMINE HCL 25 MG PO CAPS: 25.0000 mg | ORAL_CAPSULE | Freq: Four times a day (QID) | ORAL | Status: DC | PRN

## 2020-12-17 MED ORDER — ONDANSETRON HCL 4 MG/2ML IJ SOLN: 4.0000 mg | Freq: Four times a day (QID) | INTRAMUSCULAR | Status: DC | PRN

## 2020-12-17 MED ORDER — IOHEXOL 300 MG/ML  SOLN
100.0000 mL | Freq: Once | INTRAMUSCULAR | Status: AC | PRN
  Administered 2020-12-17: 100 mL via INTRAVENOUS

## 2020-12-17 MED ORDER — INSULIN ASPART 100 UNIT/ML IJ SOLN
0.0000 [IU] | Freq: Three times a day (TID) | INTRAMUSCULAR | Status: DC
  Administered 2020-12-17 – 2020-12-19 (×5): 1 [IU] via SUBCUTANEOUS
  Filled 2020-12-17: qty 0.09

## 2020-12-17 MED ORDER — ONDANSETRON 4 MG PO TBDP: 4.0000 mg | ORAL_TABLET | Freq: Four times a day (QID) | ORAL | Status: DC | PRN

## 2020-12-17 MED ORDER — PIPERACILLIN-TAZOBACTAM 3.375 G IVPB
3.3750 g | Freq: Three times a day (TID) | INTRAVENOUS | Status: DC
  Administered 2020-12-17 – 2020-12-18 (×3): 3.375 g via INTRAVENOUS
  Filled 2020-12-17 (×3): qty 50

## 2020-12-17 MED ORDER — ACETAMINOPHEN 500 MG PO TABS
1000.0000 mg | ORAL_TABLET | Freq: Four times a day (QID) | ORAL | Status: DC | PRN
  Administered 2020-12-17 – 2020-12-19 (×3): 1000 mg via ORAL
  Filled 2020-12-17 (×3): qty 2

## 2020-12-17 MED ORDER — SIMETHICONE 80 MG PO CHEW
40.0000 mg | CHEWABLE_TABLET | Freq: Four times a day (QID) | ORAL | Status: DC | PRN
  Administered 2020-12-17 – 2020-12-18 (×2): 40 mg via ORAL
  Filled 2020-12-17 (×3): qty 1

## 2020-12-17 MED ORDER — MORPHINE SULFATE (PF) 4 MG/ML IV SOLN
4.0000 mg | Freq: Once | INTRAVENOUS | Status: AC
  Administered 2020-12-17: 4 mg via INTRAVENOUS
  Filled 2020-12-17: qty 1

## 2020-12-17 MED ORDER — OXYCODONE HCL 5 MG PO TABS: 5.0000 mg | ORAL_TABLET | ORAL | Status: DC | PRN

## 2020-12-17 MED ORDER — POTASSIUM CHLORIDE IN NACL 20-0.9 MEQ/L-% IV SOLN
INTRAVENOUS | Status: DC
  Filled 2020-12-17 (×4): qty 1000

## 2020-12-17 MED ORDER — OXYCODONE HCL 5 MG PO TABS
5.0000 mg | ORAL_TABLET | ORAL | Status: DC | PRN
Start: 2020-12-17 — End: 2020-12-19
  Administered 2020-12-17: 5 mg via ORAL
  Filled 2020-12-17 (×2): qty 1

## 2020-12-17 MED ORDER — MORPHINE SULFATE (PF) 2 MG/ML IV SOLN
1.0000 mg | INTRAVENOUS | Status: DC | PRN
Start: 2020-12-17 — End: 2020-12-19
  Administered 2020-12-17: 4 mg via INTRAVENOUS
  Administered 2020-12-18: 2 mg via INTRAVENOUS
  Filled 2020-12-17: qty 1
  Filled 2020-12-17: qty 2

## 2020-12-17 MED ORDER — PIPERACILLIN-TAZOBACTAM 3.375 G IVPB 30 MIN
3.3750 g | Freq: Once | INTRAVENOUS | Status: AC
  Administered 2020-12-17: 3.375 g via INTRAVENOUS
  Filled 2020-12-17: qty 50

## 2020-12-17 MED ORDER — MELATONIN 3 MG PO TABS: 3.0000 mg | ORAL_TABLET | Freq: Every evening | ORAL | Status: DC | PRN

## 2020-12-17 MED ORDER — ONDANSETRON HCL 4 MG/2ML IJ SOLN
4.0000 mg | Freq: Once | INTRAMUSCULAR | Status: AC
  Administered 2020-12-17: 4 mg via INTRAVENOUS
  Filled 2020-12-17: qty 2

## 2020-12-17 MED ORDER — METRONIDAZOLE 500 MG/100ML IV SOLN: 500.0000 mg | Freq: Once | INTRAVENOUS | Status: DC

## 2020-12-17 MED ORDER — METOPROLOL TARTRATE 5 MG/5ML IV SOLN: 5.0000 mg | Freq: Four times a day (QID) | INTRAVENOUS | Status: DC | PRN

## 2020-12-17 NOTE — H&P (Signed)
Brad Nash May 26, 1981  [id number].    Chief Complaint/Reason for Consult: diverticulitis  HPI:  This is a 41 yo white male who was just recently diagnosed with DM about a month ago.  He was actually then admitted 3 weeks ago with diverticulitis with a small amount of extraluminal foci of gas in the mesentery.  He was admitted for several days of IV abx therapy and then discharged on augmentin to complete this course.  He was actually scheduled to see Dr. Marcello Moores today at 2:10pm.  Unfortunately, last Thursday he started having LLQ abdominal pain again.  He called our office on Friday and was told to see how the weekend went to try to make it to Monday, but if his pain worsened he may need to come back to the ED.  He denies any fevers, has been eating well and actually high fiber with a lot of salads.  He denies any N/V, but has been feeling constipated.  His pain got a lot worse last night and so he presented to the [facility] today.  He has a WBC of 13K and a CT scan that appears almost identical to his last scan 3 weeks ago.  We have been asked to see him for further evaluation and admission.  ROS: ROS: Please see HPI, otherwise all other systems have been reviewed and are negative.  History reviewed. No pertinent family history.  Past Medical History:  Diagnosis Date  . Diabetes mellitus without complication ([place])   . Diverticulosis     Past Surgical History:  Procedure Laterality Date  . INNER EAR SURGERY    . TONSILLECTOMY      Social History:  reports that he has never smoked. He has never used smokeless tobacco. He reports current drug use. Drug: Marijuana. He reports that he does not drink alcohol.  Allergies:  Allergies  Allergen Reactions  . Other Anaphylaxis    seafood    (Not in a hospital admission)    Physical Exam: Blood pressure (!) 147/85, pulse 82, temperature 98.4 F (36.9 C), temperature source Oral, resp. rate 16, height 5\' 8"  (1.727 m), weight 113.4 kg,  SpO2 98 %. General: pleasant, obese white male who is laying in bed in NAD HEENT: head is normocephalic, atraumatic.  Sclera are noninjected.  PERRL.  Ears and nose without any masses or lesions.  Mouth is pink and moist Heart: regular, rate, and rhythm.  Normal s1,s2. No obvious murmurs, gallops, or rubs noted.  Palpable radial and pedal pulses bilaterally Lungs: CTAB, no wheezes, rhonchi, or rales noted.  Respiratory effort nonlabored Abd: soft, quite tender in LLQ, but no peritonitis or rebounding, ND, obese, +BS, no masses, hernias, or organomegaly MS: all 4 extremities are symmetrical with no cyanosis, clubbing, or edema. Skin: warm and dry with no masses, lesions, or rashes.  Several tattoos. Neuro: Cranial nerves 2-12 grossly intact, sensation is normal throughout Psych: A&Ox3 with an appropriate affect.   Results for orders placed or performed during the hospital encounter of 12/17/20 (from the past 48 hour(s))  Lipase, blood     Status: None   Collection Time: 12/17/20  7:58 AM  Result Value Ref Range   Lipase 36 11 - 51 U/L    Comment: Performed at [facility], [place] [address], [place], [place] [address]  Comprehensive metabolic panel     Status: Abnormal   Collection Time: 12/17/20  7:58 AM  Result Value Ref Range   Sodium 137 135 -  145 mmol/L   Potassium 3.9 3.5 - 5.1 mmol/L   Chloride 107 98 - 111 mmol/L   CO2 21 (L) 22 - 32 mmol/L   Glucose, Bld 175 (H) 70 - 99 mg/dL    Comment: Glucose reference range applies only to samples taken after fasting for at least 8 hours.   BUN 15 6 - 20 mg/dL   Creatinine, Ser 0.73 0.61 - 1.24 mg/dL   Calcium 9.1 8.9 - 10.3 mg/dL   Total Protein 7.6 6.5 - 8.1 g/dL   Albumin 4.1 3.5 - 5.0 g/dL   AST 23 15 - 41 U/L   ALT 38 0 - 44 U/L   Alkaline Phosphatase 47 38 - 126 U/L   Total Bilirubin 0.5 0.3 - 1.2 mg/dL   GFR, Estimated >60 >60 mL/min    Comment: (NOTE) Calculated using the CKD-EPI Creatinine Equation (2021)     Anion gap 9 5 - 15    Comment: Performed at [facility], [place] [address], [place], [place] [address]  CBC     Status: Abnormal   Collection Time: 12/17/20  7:58 AM  Result Value Ref Range   WBC 13.1 (H) 4.0 - 10.5 K/uL   RBC 5.25 4.22 - 5.81 MIL/uL   Hemoglobin 15.1 13.0 - 17.0 g/dL   HCT 43.4 39.0 - 52.0 %   MCV 82.7 80.0 - 100.0 fL   MCH 28.8 26.0 - 34.0 pg   MCHC 34.8 30.0 - 36.0 g/dL   RDW 13.6 11.5 - 15.5 %   Platelets 274 150 - 400 K/uL   nRBC 0.0 0.0 - 0.2 %    Comment: Performed at [facility], [place] [address] [place], [place] [address]  Urinalysis, Routine w reflex microscopic Urine, Clean Catch     Status: Abnormal   Collection Time: 12/17/20  7:58 AM  Result Value Ref Range   Color, Urine YELLOW YELLOW   APPearance CLEAR CLEAR   Specific Gravity, Urine 1.028 1.005 - 1.030   pH 5.0 5.0 - 8.0   Glucose, UA NEGATIVE NEGATIVE mg/dL   Hgb urine dipstick NEGATIVE NEGATIVE   Bilirubin Urine NEGATIVE NEGATIVE   Ketones, ur NEGATIVE NEGATIVE mg/dL   Protein, ur 100 (A) NEGATIVE mg/dL   Nitrite NEGATIVE NEGATIVE   Leukocytes,Ua NEGATIVE NEGATIVE   RBC / HPF 0-5 0 - 5 RBC/hpf   WBC, UA 0-5 0 - 5 WBC/hpf   Bacteria, UA RARE (A) NONE SEEN   Squamous Epithelial / LPF 0-5 0 - 5   Mucus PRESENT     Comment: Performed at [facility], [place] [address], [place], [place] [address]   CT ABDOMEN PELVIS W CONTRAST  Result Date: 12/17/2020 CLINICAL DATA:  Left lower quadrant pain, history of diverticulitis EXAM: CT ABDOMEN AND PELVIS WITH CONTRAST TECHNIQUE: Multidetector CT imaging of the abdomen and pelvis was performed using the standard protocol following bolus administration of intravenous contrast. CONTRAST:  112mL OMNIPAQUE IOHEXOL 300 MG/ML  SOLN COMPARISON:  11/22/2020 FINDINGS: Lower chest: No acute abnormality. Hepatobiliary: Possible hepatic steatosis. Gallbladder is unremarkable. No biliary dilatation. Pancreas:  Unremarkable. Spleen: Unremarkable. Adrenals/Urinary Tract: Adrenals, kidneys, and bladder are unremarkable. Stomach/Bowel: Stomach is within normal limits. Bowel is normal in caliber. Colonic diverticulosis. There is wall thickening and increased infiltration of surrounding fat about the proximal sigmoid colon. Remains several small foci of extraluminal air in the same area as seen previously. No discrete rim enhancing collection. Vascular/Lymphatic: No significant vascular abnormality. Small reactive  nodes are present. No enlarged lymph nodes. Reproductive: Unremarkable. Other: Abdominal wall is unremarkable. Musculoskeletal: No acute osseous abnormality. IMPRESSION: Persistent/recurrent acute sigmoid diverticulitis. There are persistent small foci of extraluminal air in the same area as seen previously. No discrete abscess. Electronically Signed   By: Macy Mis M.D.   On: 12/17/2020 09:45      Assessment/Plan DM - hold metformin for 48 hrs given IV contrast.  SSI  Diverticulitis The patient appears to have persistent diverticulitis with a scan that appears stable from 3 weeks ago.  He has no further evidence of phlegmon, abscess, or perforation.  There are several small foci of extraluminal air in his mesentery but this is stable from 3 weeks ago to suggest no continued perforation.  We will admit for IV abx therapy, zosyn, and keep him NPO for bowel rest.  We will make a GI referral as an outpatient for c-scope to evaluate and rule out other potential causes of continued issues such as a mass not seen on CT scan.  We will then also have him dc home on 4 weeks of augmentin and follow up with Dr. Marcello Moores at that time.  No plans for acute surgical intervention at this time unless he acutely worsens.   FEN - NPO/IVFs VTE - lovenox  ID - zosyn Admit - inpatent, med-surg floor  Henreitta Cea, [facility] Surgery 12/17/2020, 11:16 AM Please see Amion for pager number during day hours  7:00am-4:30pm or 7:00am -11:30am on weekends

## 2020-12-17 NOTE — ED Provider Notes (Signed)
[facility] DEPT Provider Note   CSN: [id number] Arrival date & time: 12/17/20  0701     History Chief Complaint  Patient presents with  . Abdominal Pain    Brad Nash is a 40 y.o. male.  40 year old male with prior medical history as detailed below presents for evaluation.  Patient reports recent admission for perforated diverticulum.  Patient was on antibiotics during and immediately after his admission for same.  Patient reports that he has been off antibiotics for about 1 week.  He reports increased left lower quadrant abdominal pain over the last 3 to 4 days.  He also complains of constipation.  He denies fever.  He denies vomiting.  He had previously scheduled follow-up with surgery later this week.  The history is provided by the patient and medical records.  Abdominal Pain Pain location:  LLQ Pain quality: aching and cramping   Pain radiates to:  Does not radiate Pain severity:  Moderate Onset quality:  Gradual Duration:  3 days Timing:  Constant Progression:  Worsening Chronicity:  New      Past Medical History:  Diagnosis Date  . Diabetes mellitus without complication ([place])   . Diverticulosis     Patient Active Problem List   Diagnosis Date Noted  . Perforated diverticulum 11/23/2020  . Coccyx contusion 11/15/2018    Past Surgical History:  Procedure Laterality Date  . INNER EAR SURGERY    . TONSILLECTOMY         No family history on file.  Social History   Tobacco Use  . Smoking status: Never Smoker  . Smokeless tobacco: Never Used  Vaping Use  . Vaping Use: Never used  Substance Use Topics  . Alcohol use: No  . Drug use: Yes    Types: Marijuana    Comment: 2-3 times a week.     Home Medications Prior to Admission medications   Medication Sig Start Date End Date Taking? Authorizing Provider  amoxicillin-clavulanate (AUGMENTIN) 875-125 MG tablet Take 1 tablet by mouth every 12 (twelve) hours. 2/50/53    Leighton Ruff, MD  ELDERBERRY PO Take 1 capsule by mouth daily.    [provider]  gemfibrozil (LOPID) 600 MG tablet Take 600 mg by mouth 2 (two) times daily. 11/05/20   [provider]  metFORMIN (GLUCOPHAGE) 500 MG tablet Take 500 mg by mouth in the morning and at bedtime. 11/05/20   [provider]  Multiple Vitamin (MULTIVITAMIN WITH MINERALS) TABS tablet Take 1 tablet by mouth daily.    [provider]  Omega-3 Fatty Acids (FISH OIL PO) Take 1 capsule by mouth daily.    [provider]    Allergies    Other  Review of Systems   Review of Systems  Gastrointestinal: Positive for abdominal pain.  All other systems reviewed and are negative.   Physical Exam Updated Vital Signs BP (!) 161/94   Pulse 69   Temp 97.6 F (36.4 C) (Oral)   Resp 18   Ht 5\' 8"  (1.727 m)   Wt 113.4 kg   SpO2 94%   BMI 38.01 kg/m   Physical Exam Vitals and nursing note reviewed.  Constitutional:      General: He is not in acute distress.    Appearance: He is well-developed.  HENT:     Head: Normocephalic and atraumatic.  Eyes:     Conjunctiva/sclera: Conjunctivae normal.     Pupils: Pupils are equal, round, and reactive to light.  Cardiovascular:  Rate and Rhythm: Normal rate and regular rhythm.     Heart sounds: Normal heart sounds.  Pulmonary:     Effort: Pulmonary effort is normal. No respiratory distress.     Breath sounds: Normal breath sounds.  Abdominal:     General: There is no distension.     Palpations: Abdomen is soft.     Tenderness: There is abdominal tenderness in the left lower quadrant.  Musculoskeletal:        General: No deformity. Normal range of motion.     Cervical back: Normal range of motion and neck supple.  Skin:    General: Skin is warm and dry.  Neurological:     General: No focal deficit present.     Mental Status: He is alert and oriented to person, place, and time.     ED Results / Procedures / Treatments    Labs (all labs ordered are listed, but only abnormal results are displayed) Labs Reviewed  COMPREHENSIVE METABOLIC PANEL - Abnormal; Notable for the following components:      Result Value   CO2 21 (*)    Glucose, Bld 175 (*)    All other components within normal limits  CBC - Abnormal; Notable for the following components:   WBC 13.1 (*)    All other components within normal limits  URINALYSIS, ROUTINE W REFLEX MICROSCOPIC - Abnormal; Notable for the following components:   Protein, ur 100 (*)    Bacteria, UA RARE (*)    All other components within normal limits  LIPASE, BLOOD    EKG None  Radiology CT ABDOMEN PELVIS W CONTRAST  Result Date: 12/17/2020 CLINICAL DATA:  Left lower quadrant pain, history of diverticulitis EXAM: CT ABDOMEN AND PELVIS WITH CONTRAST TECHNIQUE: Multidetector CT imaging of the abdomen and pelvis was performed using the standard protocol following bolus administration of intravenous contrast. CONTRAST:  185mL OMNIPAQUE IOHEXOL 300 MG/ML  SOLN COMPARISON:  11/22/2020 FINDINGS: Lower chest: No acute abnormality. Hepatobiliary: Possible hepatic steatosis. Gallbladder is unremarkable. No biliary dilatation. Pancreas: Unremarkable. Spleen: Unremarkable. Adrenals/Urinary Tract: Adrenals, kidneys, and bladder are unremarkable. Stomach/Bowel: Stomach is within normal limits. Bowel is normal in caliber. Colonic diverticulosis. There is wall thickening and increased infiltration of surrounding fat about the proximal sigmoid colon. Remains several small foci of extraluminal air in the same area as seen previously. No discrete rim enhancing collection. Vascular/Lymphatic: No significant vascular abnormality. Small reactive nodes are present. No enlarged lymph nodes. Reproductive: Unremarkable. Other: Abdominal wall is unremarkable. Musculoskeletal: No acute osseous abnormality. IMPRESSION: Persistent/recurrent acute sigmoid diverticulitis. There are persistent small foci of  extraluminal air in the same area as seen previously. No discrete abscess. Electronically Signed   By: Macy Mis M.D.   On: 12/17/2020 09:45    Procedures Procedures   Medications Ordered in ED Medications  sodium chloride 0.9 % bolus 1,000 mL (1,000 mLs Intravenous New Bag/Given 12/17/20 0759)  ondansetron (ZOFRAN) injection 4 mg (4 mg Intravenous Given 12/17/20 0800)  morphine 4 MG/ML injection 4 mg (4 mg Intravenous Given 12/17/20 0800)  iohexol (OMNIPAQUE) 300 MG/ML solution 100 mL (100 mLs Intravenous Contrast Given 12/17/20 2025)    ED Course  I have reviewed the triage vital signs and the nursing notes.  Pertinent labs & imaging results that were available during my care of the patient were reviewed by me and considered in my medical decision making (see chart for details).    MDM Rules/Calculators/A&P  MDM  MSE complete  Benjiman Sedgwick was evaluated in Emergency Department on 12/17/2020 for the symptoms described in the history of present illness. He was evaluated in the context of the global COVID-19 pandemic, which necessitated consideration that the patient might be at risk for infection with the SARS-CoV-2 virus that causes COVID-19. Institutional protocols and algorithms that pertain to the evaluation of patients at risk for COVID-19 are in a state of rapid change based on information released by regulatory bodies including the CDC and federal and state organizations. These policies and algorithms were followed during the patient's care in the ED.  Patient with recurrent LLQ pain with recent hx of perforated diverticulitis.   CT demonstrates continued LLQ inflammation / sigmoid diverticulitis.  Case discussed with Surgery - will admit for IV abx and additional treatment.   Final Clinical Impression(s) / ED Diagnoses Final diagnoses:  Diverticulitis    Rx / DC Orders ED Discharge Orders    None       Valarie Merino, MD 12/17/20 1035

## 2020-12-17 NOTE — ED Triage Notes (Signed)
Patient c/o LLQ pain x 3 days. Patient reports a history of diverticulosis. Patient reports no BM in 3 days. Patient denies N/V.

## 2020-12-18 LAB — GLUCOSE, CAPILLARY
Glucose-Capillary: 144 mg/dL — ABNORMAL HIGH (ref 70–99)
Glucose-Capillary: 132 mg/dL — ABNORMAL HIGH (ref 70–99)
Glucose-Capillary: 128 mg/dL — ABNORMAL HIGH (ref 70–99)
Glucose-Capillary: 129 mg/dL — ABNORMAL HIGH (ref 70–99)

## 2020-12-18 LAB — CBC
HCT: 39.1 % (ref 39.0–52.0)
Hemoglobin: 13.3 g/dL (ref 13.0–17.0)
MCH: 28.4 pg (ref 26.0–34.0)
MCHC: 34 g/dL (ref 30.0–36.0)
MCV: 83.4 fL (ref 80.0–100.0)
Platelets: 235 10*3/uL (ref 150–400)
RBC: 4.69 MIL/uL (ref 4.22–5.81)
RDW: 13.7 % (ref 11.5–15.5)
WBC: 11.7 10*3/uL — ABNORMAL HIGH (ref 4.0–10.5)
nRBC: 0 % (ref 0.0–0.2)

## 2020-12-18 LAB — BASIC METABOLIC PANEL
Anion gap: 5 (ref 5–15)
BUN: 9 mg/dL (ref 6–20)
CO2: 26 mmol/L (ref 22–32)
Calcium: 8.9 mg/dL (ref 8.9–10.3)
Chloride: 110 mmol/L (ref 98–111)
Creatinine, Ser: 0.65 mg/dL (ref 0.61–1.24)
GFR, Estimated: >60 mL/min (ref >60)
Glucose, Bld: 139 mg/dL — ABNORMAL HIGH (ref 70–99)
Potassium: 4.2 mmol/L (ref 3.5–5.1)
Sodium: 141 mmol/L (ref 135–145)

## 2020-12-18 MED ORDER — PIPERACILLIN-TAZOBACTAM 3.375 G IVPB
3.3750 g | Freq: Three times a day (TID) | INTRAVENOUS | Status: AC
  Administered 2020-12-18: 3.375 g via INTRAVENOUS
  Filled 2020-12-18: qty 50

## 2020-12-18 MED ORDER — SODIUM CHLORIDE 0.9% FLUSH: 3.0000 mL | INTRAVENOUS | Status: DC | PRN

## 2020-12-18 MED ORDER — AMOXICILLIN-POT CLAVULANATE 875-125 MG PO TABS
1.0000 | ORAL_TABLET | Freq: Two times a day (BID) | ORAL | Status: DC
  Administered 2020-12-19: 1 via ORAL
  Filled 2020-12-18: qty 1

## 2020-12-18 MED ORDER — SODIUM CHLORIDE 0.9 % IV SOLN: 250.0000 mL | INTRAVENOUS | Status: DC | PRN

## 2020-12-18 MED ORDER — SODIUM CHLORIDE 0.9% FLUSH
3.0000 mL | Freq: Two times a day (BID) | INTRAVENOUS | Status: DC
  Administered 2020-12-18: 3 mL via INTRAVENOUS

## 2020-12-18 NOTE — Progress Notes (Signed)
Progress Note     Subjective: Patient reports some abdominal pain in LLQ/suprapubic abdomen, but improved from yesterday. He had a BM this AM and denies nausea.   Objective: Vital signs in last 24 hours: Temp:  [98.4 F (36.9 C)-98.8 F (37.1 C)] 98.5 F (36.9 C) (05/10 0145) Pulse Rate:  [72-83] 76 (05/10 0544) Resp:  [16-18] 18 (05/10 0544) BP: (131-153)/(77-103) 153/100 (05/10 0544) SpO2:  [93 %-98 %] 96 % (05/10 0544) Last BM Date: 12/15/20  Intake/Output from previous day: 05/09 0701 - 05/10 0700 In: 2881.3 [I.V.:1778.8; IV Piggyback:1102.5] Out: 1175 [Urine:1175] Intake/Output this shift: No intake/output data recorded.  PE: General: pleasant, WD, obese male who is laying in bed in NAD Heart: regular, rate, and rhythm.   Lungs: CTAB, no wheezes, rhonchi, or rales noted.  Respiratory effort nonlabored Abd: soft, mild to moderate ttp in LLQ without peritonitis or guarding, ND, +BS, no masses, hernias, or organomegaly MS: all 4 extremities are symmetrical with no cyanosis, clubbing, or edema. Skin: warm and dry with no masses, lesions, or rashes    Lab Results:  Recent Labs    12/17/20 0758 12/18/20 0443  WBC 13.1* 11.7*  HGB 15.1 13.3  HCT 43.4 39.1  PLT 274 235   BMET Recent Labs    12/17/20 0758 12/18/20 0443  NA 137 141  K 3.9 4.2  CL 107 110  CO2 21* 26  GLUCOSE 175* 139*  BUN 15 9  CREATININE 0.73 0.65  CALCIUM 9.1 8.9   PT/INR No results for input(s): LABPROT, INR in the last 72 hours. CMP     Component Value Date/Time   NA 141 12/18/2020 0443   K 4.2 12/18/2020 0443   CL 110 12/18/2020 0443   CO2 26 12/18/2020 0443   GLUCOSE 139 (H) 12/18/2020 0443   BUN 9 12/18/2020 0443   CREATININE 0.65 12/18/2020 0443   CALCIUM 8.9 12/18/2020 0443   PROT 7.6 12/17/2020 0758   ALBUMIN 4.1 12/17/2020 0758   AST 23 12/17/2020 0758   ALT 38 12/17/2020 0758   ALKPHOS 47 12/17/2020 0758   BILITOT 0.5 12/17/2020 0758   GFRNONAA >60 12/18/2020  0443   Lipase     Component Value Date/Time   LIPASE 36 12/17/2020 0758       Studies/Results: CT ABDOMEN PELVIS W CONTRAST  Result Date: 12/17/2020 CLINICAL DATA:  Left lower quadrant pain, history of diverticulitis EXAM: CT ABDOMEN AND PELVIS WITH CONTRAST TECHNIQUE: Multidetector CT imaging of the abdomen and pelvis was performed using the standard protocol following bolus administration of intravenous contrast. CONTRAST:  133mL OMNIPAQUE IOHEXOL 300 MG/ML  SOLN COMPARISON:  11/22/2020 FINDINGS: Lower chest: No acute abnormality. Hepatobiliary: Possible hepatic steatosis. Gallbladder is unremarkable. No biliary dilatation. Pancreas: Unremarkable. Spleen: Unremarkable. Adrenals/Urinary Tract: Adrenals, kidneys, and bladder are unremarkable. Stomach/Bowel: Stomach is within normal limits. Bowel is normal in caliber. Colonic diverticulosis. There is wall thickening and increased infiltration of surrounding fat about the proximal sigmoid colon. Remains several small foci of extraluminal air in the same area as seen previously. No discrete rim enhancing collection. Vascular/Lymphatic: No significant vascular abnormality. Small reactive nodes are present. No enlarged lymph nodes. Reproductive: Unremarkable. Other: Abdominal wall is unremarkable. Musculoskeletal: No acute osseous abnormality. IMPRESSION: Persistent/recurrent acute sigmoid diverticulitis. There are persistent small foci of extraluminal air in the same area as seen previously. No discrete abscess. Electronically Signed   By: Macy Mis M.D.   On: 12/17/2020 09:45    Anti-infectives: Anti-infectives (From admission, onward)  Start     Dose/Rate Route Frequency Ordered Stop   12/17/20 2000  piperacillin-tazobactam (ZOSYN) IVPB 3.375 g        3.375 g 12.5 mL/hr over 240 Minutes Intravenous Every 8 hours 12/17/20 1048     12/17/20 1015  metroNIDAZOLE (FLAGYL) IVPB 500 mg  Status:  Discontinued        500 mg 100 mL/hr over 60  Minutes Intravenous  Once 12/17/20 1004 12/17/20 1041   12/17/20 1015  piperacillin-tazobactam (ZOSYN) IVPB 3.375 g        3.375 g 100 mL/hr over 30 Minutes Intravenous  Once 12/17/20 1004 12/17/20 1252       Assessment/Plan DM - hold metformin for 48 hrs given IV contrast.  SSI  Diverticulitis - persistent diverticulitis with a scan that appears stable from 3 weeks ago.  He has no further evidence of phlegmon, abscess, or perforation.  There are several small foci of extraluminal air in his mesentery but this is stable from 3 weeks ago to suggest no continued perforation.   - WBC 11 from 13, afeb - ok to start CLD, likely advance to [facility] for dinner if tolerating clears   - will need GI referral as an OP for colonoscopy in a few weeks  - likely dc on 4 weeks augmentin and follow up with Dr. Marcello Moores - no plans for acute surgical intervention unless he fails to improve  FEN - CLD, decrease IVF VTE - lovenox  ID - zosyn 5/9>>   LOS: 1 day    Norm Parcel, [facility] Surgery 12/18/2020, 8:45 AM Please see Amion for pager number during day hours 7:00am-4:30pm

## 2020-12-18 NOTE — Discharge Instructions (Signed)
Diverticulitis  Diverticulitis is when small pouches in your colon (large intestine) get infected or swollen. This causes pain in the belly (abdomen) and watery poop (diarrhea). These pouches are called diverticula. The pouches form in people who have a condition called diverticulosis. What are the causes? This condition may be caused by poop (stool) that gets trapped in the pouches in your colon. The poop lets germs (bacteria) grow in the pouches. This causes the infection. What increases the risk? You are more likely to get this condition if you have small pouches in your colon. The risk is higher if:  You are overweight or very overweight (obese).  You do not exercise enough.  You drink alcohol.  You smoke or use products with tobacco in them.  You eat a diet that has a lot of red meat such as beef, pork, or lamb.  You eat a diet that does not have enough fiber in it.  You are older than 40 years of age. What are the signs or symptoms?  Pain in the belly. Pain is often on the left side, but it may be in other areas.  Fever and feeling cold.  Feeling like you may vomit.  Vomiting.  Having cramps.  Feeling full.  Changes to how often you poop.  Blood in your poop. How is this treated? Most cases are treated at home by:  Taking over-the-counter pain medicines.  Following a clear liquid diet.  Taking antibiotic medicines.  Resting. Very bad cases may need to be treated at a hospital. This may include:  Not eating or drinking.  Taking prescription pain medicine.  Getting antibiotic medicines through an IV tube.  Getting fluid and food through an IV tube.  Having surgery. When you are feeling better, your doctor may tell you to have a test to check your colon (colonoscopy). Follow these instructions at home: Medicines  Take over-the-counter and prescription medicines only as told by your doctor. These include: ? Antibiotics. ? Pain medicines. ? Fiber  pills. ? Probiotics. ? Stool softeners.  If you were prescribed an antibiotic medicine, take it as told by your doctor. Do not stop taking the antibiotic even if you start to feel better.  Ask your doctor if the medicine prescribed to you requires you to avoid driving or using machinery. Eating and drinking  Follow a diet as told by your doctor.  When you feel better, your doctor may tell you to change your diet. You may need to eat a lot of fiber. Fiber makes it easier to poop (have a bowel movement). Foods with fiber include: ? Berries. ? Beans. ? Lentils. ? Green vegetables.  Avoid eating red meat.   General instructions  Do not use any products that contain nicotine or tobacco, such as cigarettes, e-cigarettes, and chewing tobacco. If you need help quitting, ask your doctor.  Exercise 3 or more times a week. Try to get 30 minutes each time. Exercise enough to sweat and make your heart beat faster.  Keep all follow-up visits as told by your doctor. This is important. Contact a doctor if:  Your pain does not get better.  You are not pooping like normal. Get help right away if:  Your pain gets worse.  Your symptoms do not get better.  Your symptoms get worse very fast.  You have a fever.  You vomit more than one time.  You have poop that is: ? Bloody. ? Black. ? Tarry. Summary  This condition happens when   small pouches in your colon get infected or swollen.  Take medicines only as told by your doctor.  Follow a diet as told by your doctor.  Keep all follow-up visits as told by your doctor. This is important. This information is not intended to replace advice given to you by your health care provider. Make sure you discuss any questions you have with your health care provider. Document Revised: 05/09/2019 Document Reviewed: 05/09/2019 Elsevier Patient Education  2021 Elsevier Inc.  Low-Fiber Eating Plan Fiber is found in fruits, vegetables, whole grains,  and beans. Eating a diet low in fiber helps to reduce how often you have bowel movements and the amount of stool you produce. A low-fiber eating plan may help your digestive system heal if you:  Have certain conditions, such as Crohn's disease, diverticulitis, or irritable bowel syndrome (IBS), and are having a flare-up.  Recently had radiation therapy on your pelvis or bowel.  Recently had intestinal surgery.  Have a new surgical opening in your abdomen (colostomy or ileostomy).  Have an intestine that has narrowed (stricture). Your health care provider will tell you how long to stay on this diet and may recommend that you work with a dietitian. What are tips for following this plan? Reading food labels  Check the nutrition facts label on food products for the amount of dietary fiber.  Choose foods that have less than 2 grams (g) of fiber per serving.   Cooking  Use white flour for baking and cooking.  Cook meat using methods that keep it tender, such as braising or poaching.  Cook eggs until the yolk is completely solid.  Cook with healthy oils, such as olive oil or canola oil. Meal planning  Eat 5-6 small meals throughout the day instead of 3 large meals.  If you are lactose intolerant: ? Choose low-lactose dairy foods. ? Do not eat dairy foods if told by your health care provider or dietitian.  Limit fats and oils to less than 8 teaspoons (39 mL) a day.  Eat small portions of desserts.  Limit acidic, spicy, or fried foods to reduce gas, bloating, and discomfort. General information  Follow instructions from your health care provider or dietitian about how much fiber you should have each day.  Most people on a low-fiber eating plan should eat less than 10 g of fiber a day. Your daily fiber goal is _________________ g.  Take vitamin and mineral supplements as told by your health care provider or dietitian. Chewable or liquid forms are best when on this eating plan. A  gummy vitamin is not recommended. What foods should I eat? Fruits Soft-cooked or canned fruits without skin and seeds. Ripe banana. Applesauce. Fruit juice without pulp. Vegetables Well-cooked or canned vegetables without skin, seeds, or stems. Cooked potatoes without skins. Vegetable juice. Grains All bread and crackers made with white flour. Waffles, pancakes, and French toast. Bagels. Pretzels. Melba toast, zwieback, and matzoh. Cooked and dried cereals that do not have whole grains, added fiber, seeds, or dried fruit. [place]. Hot and cold cereals made with refined corn, rice, or oats. Plain pasta and noodles. White rice. Meats and other proteins Ground meat. Tender cuts of meat or poultry. Eggs. Fish, seafood, and shellfish. Smooth nut butters. Tofu. Dairy All milk products and drinks. Lactose-free milk, including rice, soy, and almond milk. Yogurt without fruit, nuts, chocolate, or granola mixed in. Sour cream. Cottage cheese. Cheese. Fats and oils Olive oil, canola oil, sunflower oil, flaxseed oil, avocado oil,   and grapeseed oil. Mayonnaise. Cream cheese. Margarine. Butter. Beverages Decaf coffee. Fruit and vegetable juices. Smoothies (in small amounts, with no pulp or skins, and with fruits from the recommended list). Sports drinks. Herbal tea. Water. Sweets and desserts Plain cakes. Cookies. Cream pies and pies made with recommended fruits. Pudding. Custard. Fruit gelatin. Sherbet. Ice pops. Ice cream without nuts. Hard candy. Honey. Jelly. Molasses. Syrups. Chocolate. Marshmallows. Gumdrops. Seasonings and condiments Ketchup. Mild mustard. Mild salad dressings. Plain gravies. Vinegar. Spices in moderation. Salt. Sugar. Other foods Bouillon. Broth. Cream and strained soups made from recommended foods. Casseroles made with recommended foods. The items listed above may not be a complete list of foods and beverages you can eat. Contact a dietitian for more information. What foods should I  avoid? Fruits Raw or dried fruit. Berries. Fruit juice with pulp. Prune juice. Vegetables Potato skins. Raw or undercooked vegetables. All beans and bean sprouts. Cooked greens. Corn. Peas. Cabbage. Beets. Broccoli. Brussels sprouts. Cauliflower. Mushrooms. Onions. Peppers. Parsnips. Okra. Sauerkraut. Grains Whole-wheat, whole-grain, or multigrain breads, cereals, or crackers. Rye bread. Cereals with nuts, raisins, or coconut. Bran. Granola. High-fiber cereals. Cornmeal or corn bread. Whole-grain pasta. Wild or brown rice. Quinoa. Popcorn. Buckwheat. Wheat germ. Meats and other proteins Tough, fibrous meats with gristle. Fatty meat. Poultry with skin. Fried meat, poultry, or fish. Precooked or cured meat, such as sausages or meat loaves. Bacon. Hot dogs. Nuts and chunky nut butter. Dried peas, beans, and lentils. Hummus. Dairy Yogurt with fruit, nuts, chocolate, or granola mixed in. Full-fat dairy such as whole milk, ice cream, or sour cream. Beverages Caffeinated coffee and teas. Fats and oils Avocado. Coconut. Butter. Sweets and desserts Desserts, cookies, or candies that contain nuts or coconut. Dried fruit. Jams and preserves with seeds. Marmalade. Any dessert made with fruits or grains that are not recommended. Seasonings and condiments Relish. Horseradish. Pickles. Olives. Other foods Corn tortilla chips. Soups made with vegetables or grains that are not recommended. The items listed above may not be a complete list of foods and beverages you should avoid. Contact a dietitian for more information. Summary  Most people on a low-fiber eating plan should eat less than 10 grams of fiber a day. Follow recommendations from your health care provider or dietitian about how much fiber you should have each day.  Always check nutrition facts labels to see the dietary fiber amount in packaged foods. A low-fiber food will have less than 2 grams of fiber per serving.  Try to avoid whole grains,  raw fruits and vegetables, dried fruit, tough cuts of meat, nuts, and seeds.  Take a vitamin and mineral supplement as told by your health care provider or dietitian. This information is not intended to replace advice given to you by your health care provider. Make sure you discuss any questions you have with your health care provider. Document Revised: 12/01/2019 Document Reviewed: 12/01/2019 Elsevier Patient Education  2021 Elsevier Inc.  

## 2020-12-19 LAB — BASIC METABOLIC PANEL
Anion gap: 7 (ref 5–15)
BUN: 7 mg/dL (ref 6–20)
CO2: 26 mmol/L (ref 22–32)
Calcium: 9 mg/dL (ref 8.9–10.3)
Chloride: 105 mmol/L (ref 98–111)
Creatinine, Ser: 0.74 mg/dL (ref 0.61–1.24)
GFR, Estimated: >60 mL/min (ref >60)
Glucose, Bld: 130 mg/dL — ABNORMAL HIGH (ref 70–99)
Potassium: 3.9 mmol/L (ref 3.5–5.1)
Sodium: 138 mmol/L (ref 135–145)

## 2020-12-19 LAB — CBC
HCT: 40.6 % (ref 39.0–52.0)
Hemoglobin: 14 g/dL (ref 13.0–17.0)
MCH: 28.7 pg (ref 26.0–34.0)
MCHC: 34.5 g/dL (ref 30.0–36.0)
MCV: 83.2 fL (ref 80.0–100.0)
Platelets: 241 10*3/uL (ref 150–400)
RBC: 4.88 MIL/uL (ref 4.22–5.81)
RDW: 13.4 % (ref 11.5–15.5)
WBC: 9.8 10*3/uL (ref 4.0–10.5)
nRBC: 0 % (ref 0.0–0.2)

## 2020-12-19 LAB — GLUCOSE, CAPILLARY: Glucose-Capillary: 124 mg/dL — ABNORMAL HIGH (ref 70–99)

## 2020-12-19 MED ORDER — AMOXICILLIN-POT CLAVULANATE 875-125 MG PO TABS: 1.0000 | ORAL_TABLET | Freq: Two times a day (BID) | ORAL | 0 refills | Status: AC

## 2020-12-19 MED ORDER — ACETAMINOPHEN 500 MG PO TABS: 1000.0000 mg | ORAL_TABLET | Freq: Four times a day (QID) | ORAL | Status: AC | PRN

## 2020-12-19 NOTE — Discharge Summary (Signed)
[facility] Discharge Summary   Patient ID: Brad Nash MRN: [id number] DOB/AGE: 08-16-1980 40 y.o.  Admit date: 12/17/2020 Discharge date: 12/19/2020  Admitting Diagnosis: Diverticulitis   Discharge Diagnosis Diverticulitis   Consultants None   Imaging: No results found.  Procedures None  Hospital Course:  Patient is a 40 year old male who presented to [facility] with abdominal pain.  Patient with known recent history of diverticulitis and is being followed by Dr. Marcello Moores in our office. Workup showed recurrent uncomplicated diverticulitis.  Patient was admitted and started on IV antibiotics and bowel rest. Leukocytosis and pain improved.  Diet was advanced as tolerated.  On 12/19/20, the patient was voiding well, tolerating diet, ambulating well, pain well controlled, vital signs stable and felt stable for discharge home.  Patient will follow up in our office in 3-4 weeks and knows to call with questions or concerns.  He will call to confirm appointment date/time.    Physical Exam: General: pleasant, WD, obese male who is laying in bed in NAD Heart: regular, rate, and rhythm.   Lungs: CTAB, no wheezes, rhonchi, or rales noted.  Respiratory effort nonlabored Abd: soft, minimal ttp in LLQ without peritonitis or guarding, ND, +BS, no masses, hernias, or organomegaly MS: all 4 extremities are symmetrical with no cyanosis, clubbing, or edema. Skin: warm and dry with no masses, lesions, or rashes    Allergies as of 12/19/2020      Reactions   Other Anaphylaxis   seafood      Medication List    TAKE these medications   acetaminophen 500 MG tablet Commonly known as: TYLENOL Take 2 tablets (1,000 mg total) by mouth every 6 (six) hours as needed for mild pain, moderate pain, fever or headache.   amoxicillin-clavulanate 875-125 MG tablet Commonly known as: AUGMENTIN Take 1 tablet by mouth every 12 (twelve) hours for 28 days.   ELDERBERRY PO Take 1 capsule by mouth  daily.   FISH OIL PO Take 1 capsule by mouth daily.   gemfibrozil 600 MG tablet Commonly known as: LOPID Take 600 mg by mouth 2 (two) times daily.   metFORMIN 500 MG tablet Commonly known as: GLUCOPHAGE Take 500 mg by mouth in the morning and at bedtime.   multivitamin with minerals Tabs tablet Take 1 tablet by mouth daily.         Follow-up Information    Leighton Ruff, MD. Call.   Specialties: General Surgery, Colon and Rectal Surgery Why: Our office is scheduling a follow up in 3-4 weeks. Call to confirm appointment date/time. You are also being referred to gastroenterology for colonoscopy in 6-8 weeks.  Contact information: [place] [address] [phone]        Chesley Noon, MD. Call.   Specialty: Family Medicine Why: Call and schedule an appointment to discuss hypertension.  Contact information: [place] [address] [phone]               Signed: [facility] 12/19/2020, 10:00 AM Please see Amion for pager number during day hours 7:00am-4:30pm

## 2020-12-19 NOTE — Progress Notes (Signed)
Discharge instructions given to patient and all questions were answered.  

## 2021-01-14 ENCOUNTER — Encounter: Payer: Self-pay | Admitting: Gastroenterology

## 2021-02-13 ENCOUNTER — Ambulatory Visit (INDEPENDENT_AMBULATORY_CARE_PROVIDER_SITE_OTHER): Payer: 59 | Admitting: Gastroenterology

## 2021-02-13 ENCOUNTER — Encounter: Payer: Self-pay | Admitting: Gastroenterology

## 2021-02-13 ENCOUNTER — Other Ambulatory Visit: Payer: Self-pay

## 2021-02-13 VITALS — BP 122/70 | HR 70 | Ht 68.0 in | Wt 250.2 lb

## 2021-02-13 DIAGNOSIS — K5732 Diverticulitis of large intestine without perforation or abscess without bleeding: Secondary | ICD-10-CM

## 2021-02-13 MED ORDER — NA SULFATE-K SULFATE-MG SULF 17.5-3.13-1.6 GM/177ML PO SOLN: 1.0000 | Freq: Once | ORAL | 0 refills | Status: AC

## 2021-02-13 NOTE — Progress Notes (Addendum)
02/13/2021 Brad Nash [id number] [birth year]   HISTORY OF PRESENT ILLNESS: This is a 40 year old male who is new to our office.  He has been referred here by Dr. Marcello Moores from [facility] to discuss a colonoscopy.  He had diverticulitis in April with a CT scan that showed the following:  IMPRESSION: 1. Acute perforated diverticulitis of the proximal sigmoid/distal descending colon centered upon a culprit diverticulum in the left lower quadrant with small amount of extraluminal gas, associated phlegmon with thickening of the adjacent peritoneum to suggest features associated peritonitis. No organized abscess or collection. No large spillage of bowel contents, organized abscess or collection is seen. 2. Hepatic steatosis.  He was treated with IV antibiotics and then discharged from the hospital.  His pain ended up worsening so he returned to the hospital in May and repeat CT scan of the abdomen and pelvis on 12/17/2020 showed the following:  IMPRESSION: Persistent/recurrent acute sigmoid diverticulitis. There are persistent small foci of extraluminal air in the same area as seen previously. No discrete abscess.  He again received IV antibiotics and then was discharged on a 28-day course of Augmentin.  He completed that about a month ago.  He has not had any further abdominal pain.  He says that he moving his bowels once or twice a day.  He denies seeing any blood in his stools.  He has been taking a probiotic.   Past Medical History:  Diagnosis Date   Diabetes mellitus without complication ([place])    Diverticulosis    Past Surgical History:  Procedure Laterality Date   INNER EAR SURGERY     TONSILLECTOMY      reports that he has quit smoking. His smoking use included cigarettes. He has never used smokeless tobacco. He reports previous alcohol use. He reports previous drug use. Drug: Marijuana. family history includes Stomach cancer in his paternal grandfather and paternal  uncle. Allergies  Allergen Reactions   Other Anaphylaxis    seafood      Outpatient Encounter Medications as of 02/13/2021  Medication Sig   acetaminophen (TYLENOL) 500 MG tablet Take 2 tablets (1,000 mg total) by mouth every 6 (six) hours as needed for mild pain, moderate pain, fever or headache.   ELDERBERRY PO Take 1 capsule by mouth daily.   gemfibrozil (LOPID) 600 MG tablet Take 600 mg by mouth 2 (two) times daily.   lisinopril (ZESTRIL) 10 MG tablet Take 10 mg by mouth daily.   metFORMIN (GLUCOPHAGE) 500 MG tablet Take 500 mg by mouth in the morning and at bedtime.   Multiple Vitamin (MULTIVITAMIN WITH MINERALS) TABS tablet Take 1 tablet by mouth daily.   Omega-3 Fatty Acids (FISH OIL PO) Take 1 capsule by mouth daily.   Probiotic Product (PROBIOTIC DAILY PO) Take 2 tablets by mouth daily at 2 PM.   rosuvastatin (CRESTOR) 5 MG tablet Take 5 mg by mouth daily.   No facility-administered encounter medications on file as of 02/13/2021.    REVIEW OF SYSTEMS  : All other systems reviewed and negative except where noted in the History of Present Illness.   PHYSICAL EXAM: BP 122/70 (BP Location: Left Arm, Patient Position: Sitting, Cuff Size: Normal)   Pulse 70   Ht 5\' 8"  (1.727 m)   Wt 250 lb 4 oz (113.5 kg)   SpO2 98%   BMI 38.05 kg/m  General: Well developed white male in no acute distress Head: Normocephalic and atraumatic Eyes:  Sclerae anicteric, conjunctiva pink. Ears: Normal auditory  acuity Lungs: Clear throughout to auscultation; no W/R/R. Heart: Regular rate and rhythm; no M/R/G. Abdomen: Soft, non-distended.  BS present.  Non-tender. Rectal:  Will be done at the time of colonoscopy. Musculoskeletal: Symmetrical with no gross deformities  Skin: No lesions on visible extremities Extremities: No edema  Neurological: Alert oriented x 4, grossly non-focal Psychological:  Alert and cooperative. Normal mood and affect  ASSESSMENT AND PLAN: *Diverticulitis: Acute  sigmoid diverticulitis with microperforation, but no abscess.  Initially seen in April and then persistent on CT scan in May.  He completed IV antibiotics and then a 28-day course of Augmentin.  He has had no further pain.  CCS requesting colonoscopy.  We will schedule that a couple more weeks out.  It is being scheduled with Dr. Tarri Glenn.  The risks, benefits, and alternatives to colonoscopy were discussed with the patient and he consents to proceed.   CC:  Chesley Noon, MD CC:  Dr. Leighton Ruff

## 2021-02-13 NOTE — Progress Notes (Signed)
Reviewed and agree with management plans. ? ?Mahin Guardia L. Arless Vineyard, MD, MPH  ?

## 2021-02-13 NOTE — Patient Instructions (Signed)
You have been scheduled for a colonoscopy. Please follow written instructions given to you at your visit today.  Please pick up your prep supplies at the pharmacy within the next 1-3 days. If you use inhalers (even only as needed), please bring them with you on the day of your procedure.   If you are age 41 or older, your body mass index should be between 23-30. Your Body mass index is 38.05 kg/m. If this is out of the aforementioned range listed, please consider follow up with your Primary Care Provider.  If you are age 30 or younger, your body mass index should be between 19-25. Your Body mass index is 38.05 kg/m. If this is out of the aformentioned range listed, please consider follow up with your Primary Care Provider.   __________________________________________________________  The [facility] providers would like to encourage you to use [facility] to communicate with providers for non-urgent requests or questions.  Due to long hold times on the telephone, sending your provider a message by [facility] may be a faster and more efficient way to get a response.  Please allow 48 business hours for a response.  Please remember that this is for non-urgent requests.

## 2021-03-01 ENCOUNTER — Encounter: Payer: Self-pay | Admitting: Gastroenterology

## 2021-03-01 ENCOUNTER — Other Ambulatory Visit: Payer: Self-pay

## 2021-03-01 ENCOUNTER — Ambulatory Visit (AMBULATORY_SURGERY_CENTER): Payer: 59 | Admitting: Gastroenterology

## 2021-03-01 VITALS — BP 107/73 | HR 65 | Temp 97.5°F | Resp 24 | Ht 68.0 in | Wt 250.0 lb

## 2021-03-01 DIAGNOSIS — D122 Benign neoplasm of ascending colon: Secondary | ICD-10-CM

## 2021-03-01 DIAGNOSIS — D120 Benign neoplasm of cecum: Secondary | ICD-10-CM

## 2021-03-01 DIAGNOSIS — K5732 Diverticulitis of large intestine without perforation or abscess without bleeding: Secondary | ICD-10-CM

## 2021-03-01 DIAGNOSIS — D128 Benign neoplasm of rectum: Secondary | ICD-10-CM

## 2021-03-01 MED ORDER — SODIUM CHLORIDE 0.9 % IV SOLN: 500.0000 mL | Freq: Once | INTRAVENOUS | Status: DC

## 2021-03-01 NOTE — Progress Notes (Signed)
Pt's states no medical or surgical changes since previsit or office visit. 

## 2021-03-01 NOTE — Progress Notes (Signed)
PT taken to PACU. Monitors in place. VSS. Report given to RN. 

## 2021-03-01 NOTE — Patient Instructions (Signed)
Handouts given;  polyps, hemorrhoids, diverticulosis, high fiber diet Continue current medications Await pathology results Start a high fiber diet Drink at least 64oz water daily  YOU HAD AN ENDOSCOPIC PROCEDURE TODAY AT [facility]:   Refer to the procedure report that was given to you for any specific questions about what was found during the examination.  If the procedure report does not answer your questions, please call your gastroenterologist to clarify.  If you requested that your care partner not be given the details of your procedure findings, then the procedure report has been included in a sealed envelope for you to review at your convenience later.  YOU SHOULD EXPECT: Some feelings of bloating in the abdomen. Passage of more gas than usual.  Walking can help get rid of the air that was put into your GI tract during the procedure and reduce the bloating. If you had a lower endoscopy (such as a colonoscopy or flexible sigmoidoscopy) you may notice spotting of blood in your stool or on the toilet paper. If you underwent a bowel prep for your procedure, you may not have a normal bowel movement for a few days.  Please Note:  You might notice some irritation and congestion in your nose or some drainage.  This is from the oxygen used during your procedure.  There is no need for concern and it should clear up in a day or so.  SYMPTOMS TO REPORT IMMEDIATELY:  Following lower endoscopy (colonoscopy or flexible sigmoidoscopy):  Excessive amounts of blood in the stool  Significant tenderness or worsening of abdominal pains  Swelling of the abdomen that is new, acute  Fever of 100F or higher  For urgent or emergent issues, a gastroenterologist can be reached at any hour by calling [phone]. Do not use MyChart messaging for urgent concerns.   DIET:  We do recommend a small meal at first, but then you may proceed to your regular diet.  Drink plenty of fluids but you  should avoid alcoholic beverages for 24 hours.  ACTIVITY:  You should plan to take it easy for the rest of today and you should NOT DRIVE or use heavy machinery until tomorrow (because of the sedation medicines used during the test).    FOLLOW UP: Our staff will call the number listed on your records 48-72 hours following your procedure to check on you and address any questions or concerns that you may have regarding the information given to you following your procedure. If we do not reach you, we will leave a message.  We will attempt to reach you two times.  During this call, we will ask if you have developed any symptoms of COVID 19. If you develop any symptoms (ie: fever, flu-like symptoms, shortness of breath, cough etc.) before then, please call [phone].  If you test positive for Covid 19 in the 2 weeks post procedure, please call and report this information to [place].    If any biopsies were taken you will be contacted by phone or by letter within the next 1-3 weeks.  Please call us at [phone] if you have not heard about the biopsies in 3 weeks.   SIGNATURES/CONFIDENTIALITY: You and/or your care partner have signed paperwork which will be entered into your electronic medical record.  These signatures attest to the fact that that the information above on your After Visit Summary has been reviewed and is understood.  Full responsibility of the confidentiality of this discharge information lies  with you and/or your care-partner.  

## 2021-03-01 NOTE — Progress Notes (Signed)
Called to room to assist during endoscopic procedure.  Patient ID and intended procedure confirmed with present staff. Received instructions for my participation in the procedure from the performing physician.  

## 2021-03-01 NOTE — Op Note (Signed)
[facility] Patient Name: Brad Nash Procedure Date: 03/01/2021 11:12 AM MRN: [id number] Endoscopist: Thornton Park MD, MD Age: 40 Referring MD:  Date of Birth: June 16, 1981 Gender: Male Account #: [id number] Procedure:                Colonoscopy Indications:              Follow-up of diverticulitis Medicines:                Monitored Anesthesia Care Procedure:                Pre-Anesthesia Assessment:                           - Prior to the procedure, a History and Physical                            was performed, and patient medications and                            allergies were reviewed. The patient's tolerance of                            previous anesthesia was also reviewed. The risks                            and benefits of the procedure and the sedation                            options and risks were discussed with the patient.                            All questions were answered, and informed consent                            was obtained. Prior Anticoagulants: The patient has                            taken no previous anticoagulant or antiplatelet                            agents. ASA Grade Assessment: II - A patient with                            mild systemic disease. After reviewing the risks                            and benefits, the patient was deemed in                            satisfactory condition to undergo the procedure.                           After obtaining informed consent, the colonoscope  was passed under direct vision. Throughout the                            procedure, the patient's blood pressure, pulse, and                            oxygen saturations were monitored continuously. The                            Olympus CF-HQ190L [id number]) Colonoscope was                            introduced through the anus and advanced to the 3                            cm into the ileum. A second forward  view of the                            right colon was performed. The colonoscopy was                            performed without difficulty. The patient tolerated                            the procedure well. The quality of the bowel                            preparation was good. The terminal ileum, ileocecal                            valve, appendiceal orifice, and rectum were                            photographed. Scope In: 11:23:40 AM Scope Out: 11:40:29 AM Scope Withdrawal Time: 0 hours 15 minutes 27 seconds  Total Procedure Duration: 0 hours 16 minutes 49 seconds  Findings:                 The perianal and digital rectal examinations were                            normal.                           Non-bleeding internal hemorrhoids were found.                           Multiple small and large-mouthed diverticula were                            found in the sigmoid colon and descending colon.                            There was no active diverticulitis.  A patchy area of mildly congested and erythematous                            mucosa was found in the sigmoid colon in the area                            of the most extensive diverticulosis. Biopsies were                            taken with a cold forceps for histology. Estimated                            blood loss was minimal.                           A 2 mm polyp was found in the rectum. The polyp was                            sessile. The polyp was removed with a cold snare.                            Resection and retrieval were complete. Estimated                            blood loss was minimal.                           A 1 mm polyp was found in the ascending colon. The                            polyp was sessile. The polyp was removed with a                            cold snare. Resection and retrieval were complete.                            Estimated blood loss was minimal.                            A 12 mm polyp was found in the cecum. The polyp was                            semi-pedunculated. The polyp was removed with a                            cold snare. Resection and retrieval were complete.                            Estimated blood loss was minimal.                           The exam was otherwise without abnormality on  direct and retroflexion views. Complications:            No immediate complications. Estimated blood loss:                            Minimal. Estimated Blood Loss:     Estimated blood loss was minimal. Impression:               - Non-bleeding internal hemorrhoids.                           - Diverticulosis in the sigmoid colon and in the                            descending colon.                           - Congested and erythematous mucosa in the sigmoid                            colon. Biopsied.                           - One 2 mm polyp in the rectum, removed with a cold                            snare. Resected and retrieved.                           - One 1 mm polyp in the ascending colon, removed                            with a cold snare. Resected and retrieved.                           - One 12 mm polyp in the cecum, removed with a cold                            snare. Resected and retrieved.                           - The examination was otherwise normal on direct                            and retroflexion views. Recommendation:           - Patient has a contact number available for                            emergencies. The signs and symptoms of potential                            delayed complications were discussed with the                            patient. Return to normal activities tomorrow.  Written discharge instructions were provided to the                            patient.                           - Continue present medications.                            - Await pathology results.                           - Repeat colonoscopy date to be determined after                            pending pathology results are reviewed for                            surveillance.                           - Follow a high fiber diet. Drink at least 64                            ounces of water daily. Add a daily stool bulking                            agent such as psyllium (an exampled would be                            Metamucil).                           - Emerging evidence supports eating a diet of                            fruits, vegetables, grains, calcium, and yogurt                            while reducing red meat and alcohol may reduce the                            risk of colon cancer.                           - Given these results, all first degree relatives                            (brothers, sisters, children, parents) should start                            colon cancer screening at age 18.                           - Thank you for allowing me to be involved in your  colon cancer prevention. Thornton Park MD, MD 03/01/2021 11:46:23 AM This report has been signed electronically.

## 2021-03-05 ENCOUNTER — Telehealth: Payer: Self-pay

## 2021-03-05 NOTE — Telephone Encounter (Signed)
  Follow up Call-  Call back number 03/01/2021  Post procedure Call Back phone  # [phone]  Permission to leave phone message Yes  Some recent data might be hidden     Patient questions:  Do you have a fever, pain , or abdominal swelling? No. Pain Score  0 *  Have you tolerated food without any problems? Yes.    Have you been able to return to your normal activities? Yes.    Do you have any questions about your discharge instructions: Diet   No. Medications  No. Follow up visit  No.  Do you have questions or concerns about your Care? No.  Actions: * If pain score is 4 or above: No action needed, pain <4.

## 2021-03-05 NOTE — Telephone Encounter (Signed)
Left message on follow up call. 

## 2021-03-07 ENCOUNTER — Encounter: Payer: Self-pay | Admitting: Gastroenterology

## 2021-09-12 IMAGING — CT CT ABD-PELV W/ CM
2 of 5 series · 17 of 46 positions shown, 19 images · IV contrast (omnipaque)
Comparison: 11/22/2020

CLINICAL DATA: Left lower quadrant pain, history of diverticulitis

EXAM:
CT ABDOMEN AND PELVIS WITH CONTRAST
TECHNIQUE: Multidetector CT imaging of the abdomen and pelvis was performed
using the standard protocol following bolus administration of
intravenous contrast.
CONTRAST:  100mL OMNIPAQUE IOHEXOL 300 MG/ML  SOLN

[Series 2: axial st · axial · 0.98mm/px · z∈[-650,-195]mm · 14 of 107 slices shown, 16 images]
[im 8/107  soft-tissue]
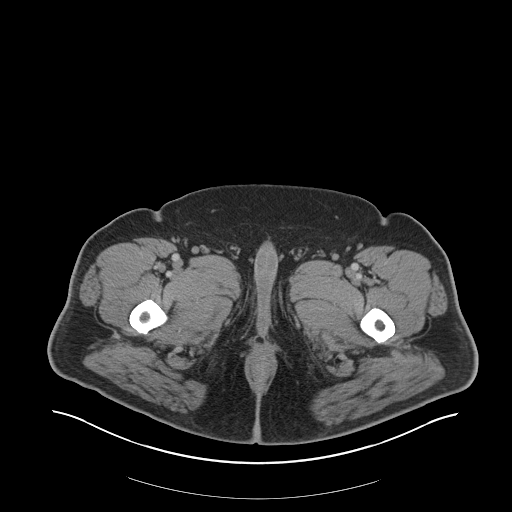
[im 8/107  bone]
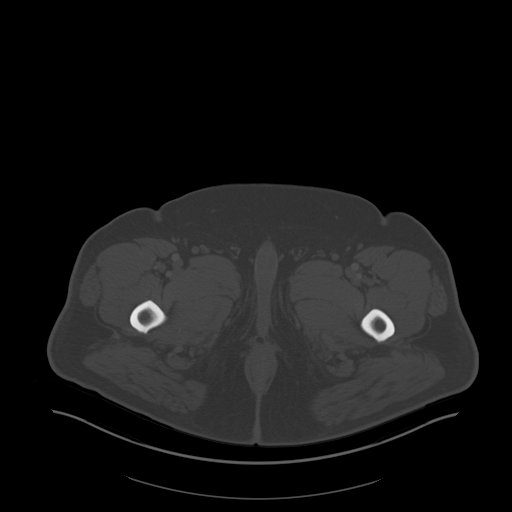
[im 15/107  soft-tissue]
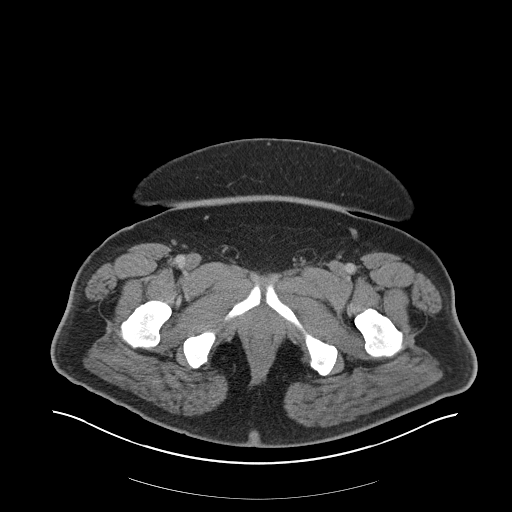
[im 22/107  soft-tissue]
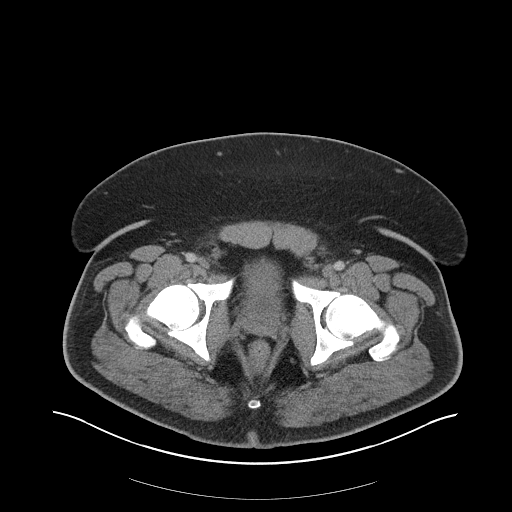
[im 29/107  soft-tissue]
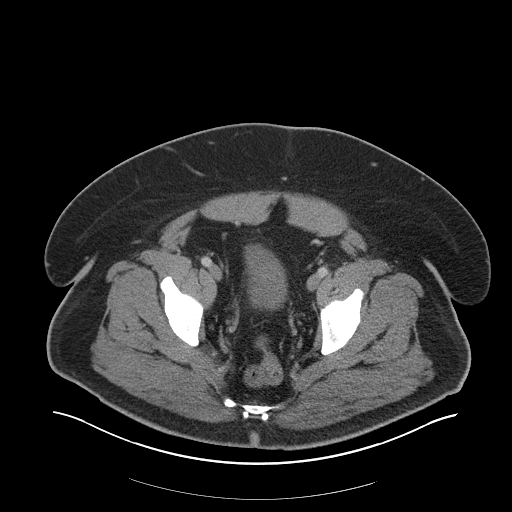
[im 36/107  soft-tissue]
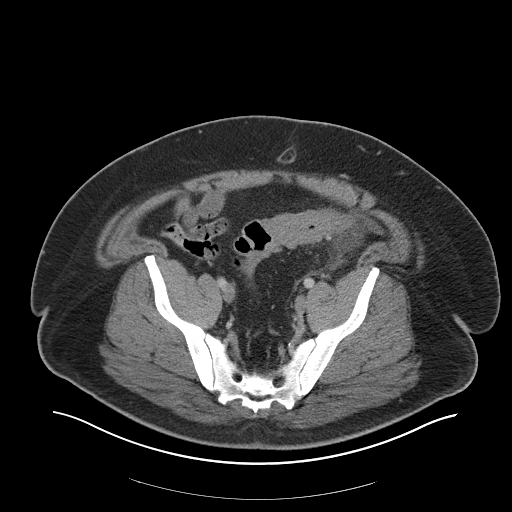
[im 43/107  soft-tissue]
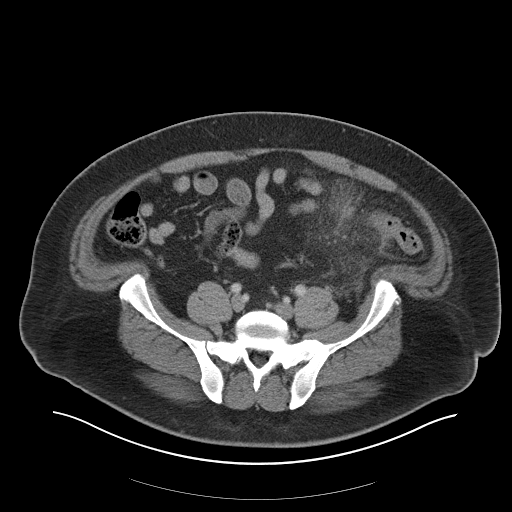
[im 50/107  soft-tissue]
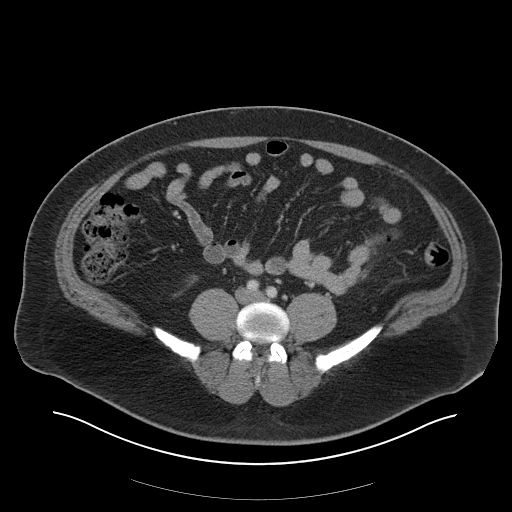
[im 57/107  soft-tissue]
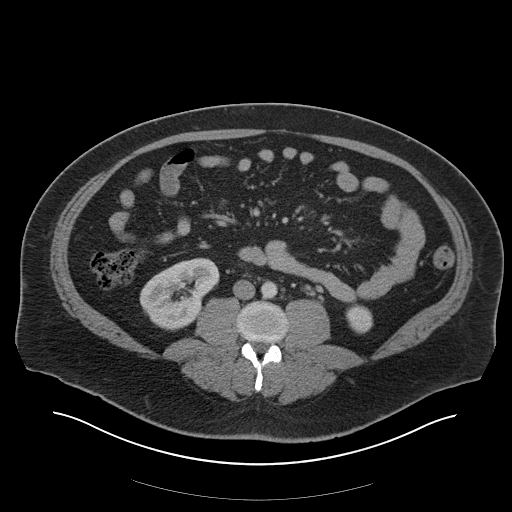
[im 64/107  soft-tissue]
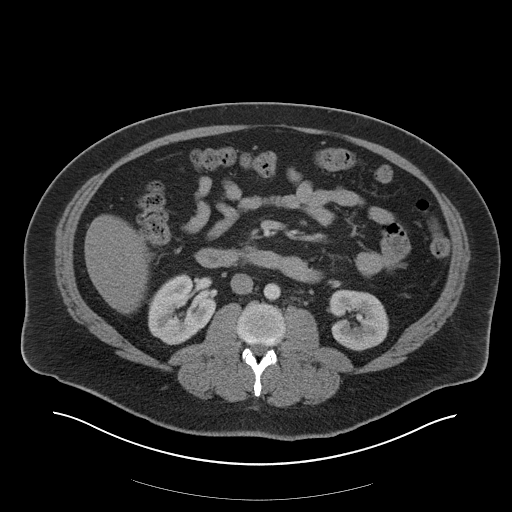
[im 64/107  bone]
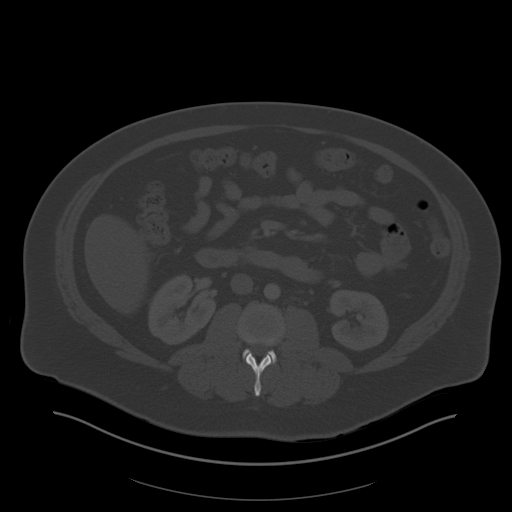
[im 71/107  soft-tissue]
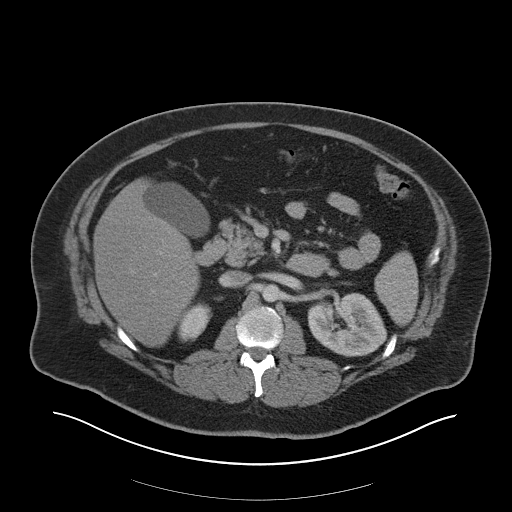
[im 78/107  soft-tissue]
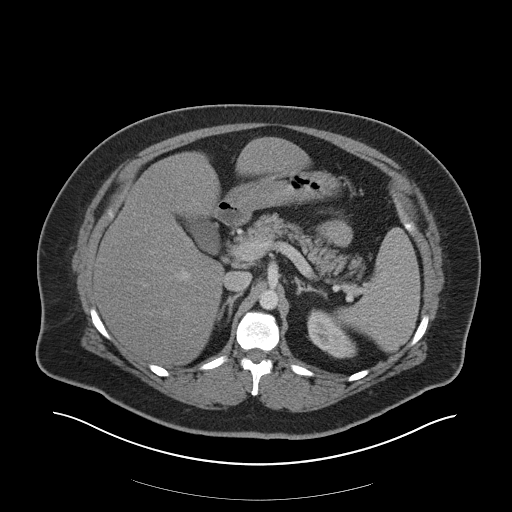
[im 85/107  soft-tissue]
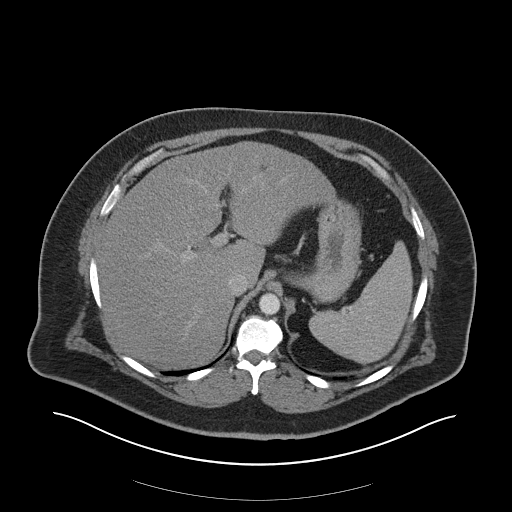
[im 92/107  soft-tissue]
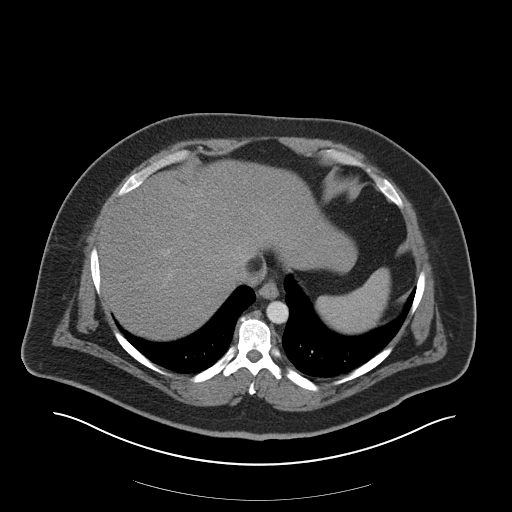
[im 99/107  soft-tissue]
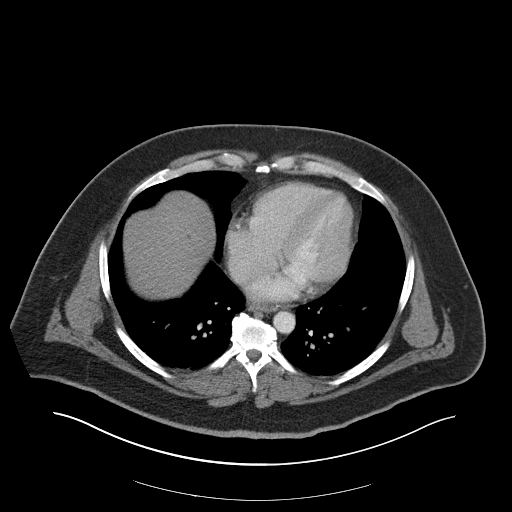

[Series 4: coronal st · coronal · 0.93mm/px · 3 of 181 slices shown]
[im 61/181  soft-tissue]
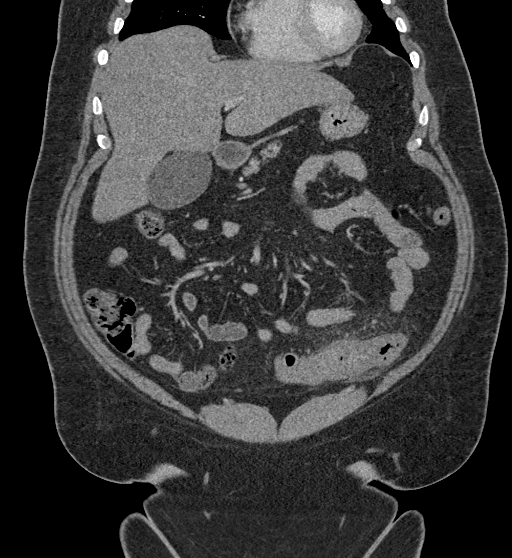
[im 81/181  soft-tissue]
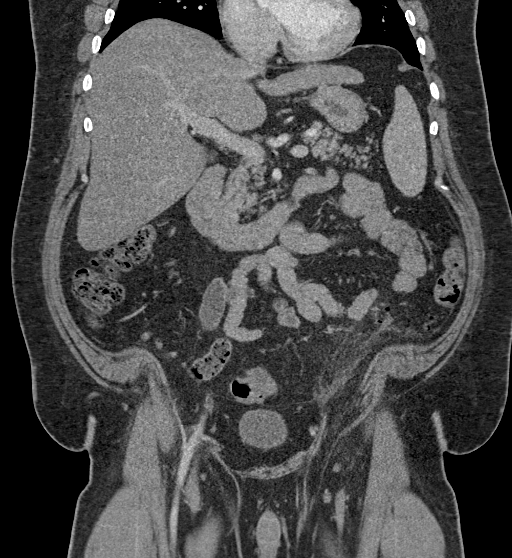
[im 101/181  soft-tissue]
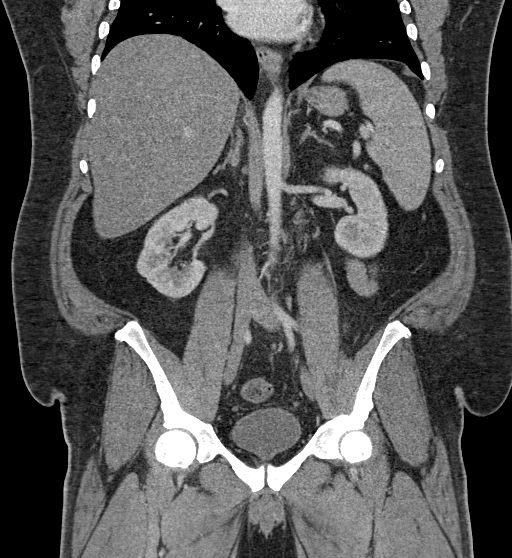

[17 of 46 positions shown; findings below may reference images not displayed]

FINDINGS: Lower chest: No acute abnormality.

Hepatobiliary: Possible hepatic steatosis. Gallbladder is
unremarkable. No biliary dilatation.

Pancreas: Unremarkable.

Spleen: Unremarkable.

Adrenals/Urinary Tract: Adrenals, kidneys, and bladder are
unremarkable.

Stomach/Bowel: Stomach is within normal limits. Bowel is normal in
caliber. Colonic diverticulosis. There is wall thickening and
increased infiltration of surrounding fat about the proximal sigmoid
colon. Remains several small foci of extraluminal air in the same
area as seen previously. No discrete rim enhancing collection.

Vascular/Lymphatic: No significant vascular abnormality. Small
reactive nodes are present. No enlarged lymph nodes.

Reproductive: Unremarkable.

Other: Abdominal wall is unremarkable.

Musculoskeletal: No acute osseous abnormality.
IMPRESSION: Persistent/recurrent acute sigmoid diverticulitis. There are
persistent small foci of extraluminal air in the same area as seen
previously. No discrete abscess.

## 2022-10-27 ENCOUNTER — Other Ambulatory Visit: Payer: Self-pay

## 2022-10-27 ENCOUNTER — Emergency Department (HOSPITAL_COMMUNITY)
Admission: EM | Admit: 2022-10-27 | Discharge: 2022-10-28 | Disposition: A | Discharge status: Home / Self Care | Payer: Commercial Managed Care - PPO | Attending: Emergency Medicine | Admitting: Emergency Medicine

## 2022-10-27 ENCOUNTER — Encounter (HOSPITAL_COMMUNITY): Payer: Self-pay

## 2022-10-27 ENCOUNTER — Emergency Department (HOSPITAL_COMMUNITY): Payer: Commercial Managed Care - PPO

## 2022-10-27 DIAGNOSIS — W108XXA Fall (on) (from) other stairs and steps, initial encounter: Secondary | ICD-10-CM | POA: Diagnosis not present

## 2022-10-27 DIAGNOSIS — S8992XA Unspecified injury of left lower leg, initial encounter: Secondary | ICD-10-CM | POA: Diagnosis present

## 2022-10-27 DIAGNOSIS — E119 Type 2 diabetes mellitus without complications: Secondary | ICD-10-CM | POA: Insufficient documentation

## 2022-10-27 DIAGNOSIS — Z7984 Long term (current) use of oral hypoglycemic drugs: Secondary | ICD-10-CM | POA: Diagnosis not present

## 2022-10-27 DIAGNOSIS — S8012XA Contusion of left lower leg, initial encounter: Secondary | ICD-10-CM

## 2022-10-27 LAB — CBC WITH DIFFERENTIAL/PLATELET
Abs Immature Granulocytes: 0.02 10*3/uL (ref 0.00–0.07)
Basophils Absolute: 0.1 10*3/uL (ref 0.0–0.1)
Basophils Relative: 1 %
Eosinophils Absolute: 0.3 10*3/uL (ref 0.0–0.5)
Eosinophils Relative: 3 %
HCT: 43.8 % (ref 39.0–52.0)
Hemoglobin: 15.4 g/dL (ref 13.0–17.0)
Immature Granulocytes: 0 %
Lymphocytes Relative: 37 %
Lymphs Abs: 3.4 10*3/uL (ref 0.7–4.0)
MCH: 29.1 pg (ref 26.0–34.0)
MCHC: 35.2 g/dL (ref 30.0–36.0)
MCV: 82.8 fL (ref 80.0–100.0)
Monocytes Absolute: 0.6 10*3/uL (ref 0.1–1.0)
Monocytes Relative: 6 %
Neutro Abs: 4.9 10*3/uL (ref 1.7–7.7)
Neutrophils Relative %: 53 %
Platelets: 204 10*3/uL (ref 150–400)
RBC: 5.29 MIL/uL (ref 4.22–5.81)
RDW: 13.3 % (ref 11.5–15.5)
WBC: 9.2 10*3/uL (ref 4.0–10.5)
nRBC: 0 % (ref 0.0–0.2)

## 2022-10-27 MED ORDER — OXYCODONE HCL 5 MG PO TABS
5.0000 mg | ORAL_TABLET | Freq: Once | ORAL | Status: AC
  Administered 2022-10-27: 5 mg via ORAL
  Filled 2022-10-27: qty 1

## 2022-10-27 NOTE — ED Triage Notes (Signed)
Pt states that he fell down the steps earlier today and now reports L leg/calf pain and swelling.

## 2022-10-27 NOTE — ED Provider Triage Note (Signed)
Emergency Medicine Provider Triage Evaluation Note  Brad Nash , a 42 y.o. male  was evaluated in triage.  Pt complains of left lower extremity pain and swelling.  He has a history of diabetes.  He had a fall on some steps earlier today and struck the back of his left calf.  He has had significant pain since that time.  He is able to bear weight but with great discomfort.  He states that he took some medicine at home and fell asleep.  When he awoke he had significant swelling of the calf.  He denies anticoagulation.  No distal numbness or tingling in the foot or toes.  Review of Systems  Positive: Leg pain and swelling Negative: Numbness  Physical Exam  BP (!) 156/97   Pulse 92   Temp 98.7 F (37.1 C)   Resp 17   Ht 5\' 8"  (1.727 m)   Wt 115.7 kg   SpO2 95%   BMI 38.77 kg/m  Gen:   Awake, uncomfortable with bearing weight and transferring in the room Resp:  Normal effort  MSK:   Left calf swelling without ecchymosis compared to the right Other:  Left DP pulse present, 2+  Medical Decision Making  Medically screening exam initiated at 10:30 PM.  Appropriate orders placed.  Keric Lanclos was informed that the remainder of the evaluation will be completed by another provider, this initial triage assessment does not replace that evaluation, and the importance of remaining in the ED until their evaluation is complete.     Carlisle Cater, PA-C 10/27/22 2232

## 2022-10-27 NOTE — ED Provider Notes (Incomplete)
[place] EMERGENCY DEPARTMENT AT [facility] Provider Note   CSN: [id number] Arrival date & time: 10/27/22  2058     History {Add pertinent medical, surgical, social history, OB history to HPI:1} Chief Complaint  Patient presents with  . Leg Pain    Brad Nash is a 42 y.o. male.  HPI     Home Medications Prior to Admission medications   Medication Sig Start Date End Date Taking? Authorizing Provider  acetaminophen (TYLENOL) 500 MG tablet Take 2 tablets (1,000 mg total) by mouth every 6 (six) hours as needed for mild pain, moderate pain, fever or headache. 12/19/20   Norm Parcel, PA-C  ELDERBERRY PO Take 1 capsule by mouth daily.    [provider]  gemfibrozil (LOPID) 600 MG tablet Take 600 mg by mouth 2 (two) times daily. 11/05/20   [provider]  lisinopril (ZESTRIL) 10 MG tablet Take 10 mg by mouth daily. 12/19/20   [provider]  metFORMIN (GLUCOPHAGE) 500 MG tablet Take 500 mg by mouth in the morning and at bedtime. 11/05/20   [provider]  Multiple Vitamin (MULTIVITAMIN WITH MINERALS) TABS tablet Take 1 tablet by mouth daily.    [provider]  Omega-3 Fatty Acids (FISH OIL PO) Take 1 capsule by mouth daily.    [provider]  Probiotic Product (PROBIOTIC DAILY PO) Take 2 tablets by mouth daily at 2 PM.    [provider]  rosuvastatin (CRESTOR) 5 MG tablet Take 5 mg by mouth daily. 02/06/21   [provider]      Allergies    Other    Review of Systems   Review of Systems  Physical Exam Updated Vital Signs BP (!) 156/97   Pulse 92   Temp 98.7 F (37.1 C)   Resp 17   Ht 5\' 8"  (1.727 m)   Wt 115.7 kg   SpO2 95%   BMI 38.77 kg/m  Physical Exam  ED Results / Procedures / Treatments   Labs (all labs ordered are listed, but only abnormal results are displayed) Labs Reviewed  CBC WITH DIFFERENTIAL/PLATELET  BASIC METABOLIC PANEL    EKG None  Radiology DG  Tibia/Fibula Left  Result Date: 10/27/2022 CLINICAL DATA:  Fall and left calf swelling. EXAM: LEFT TIBIA AND FIBULA - 2 VIEW COMPARISON:  None Available. FINDINGS: No acute fracture or dislocation. The bones are well mineralized. No significant arthritic changes. There is prominence of the soft tissues of the calf concerning for hematoma. Clinical correlation is recommended. No foreign object or soft tissue air. IMPRESSION: 1. No acute fracture or dislocation. 2. Soft tissue swelling of the calf. Electronically Signed   By: Anner Crete M.D.   On: 10/27/2022 23:08    Procedures Procedures  {Document cardiac monitor, telemetry assessment procedure when appropriate:1}  Medications Ordered in ED Medications  oxyCODONE (Oxy IR/ROXICODONE) immediate release tablet 5 mg (5 mg Oral Given 10/27/22 2334)    ED Course/ Medical Decision Making/ A&P   {   Click here for ABCD2, HEART and other calculatorsREFRESH Note before signing :1}                          Medical Decision Making  ***  {Document critical care time when appropriate:1} {Document review of labs and clinical decision tools ie heart score, Chads2Vasc2 etc:1}  {Document your independent review of radiology images, and any outside records:1} {Document your discussion with family members,  caretakers, and with consultants:1} {Document social determinants of health affecting pt's care:1} {Document your decision making why or why not admission, treatments were needed:1} Final Clinical Impression(s) / ED Diagnoses Final diagnoses:  None    Rx / DC Orders ED Discharge Orders     None

## 2022-10-27 NOTE — ED Provider Notes (Signed)
EMERGENCY DEPARTMENT AT [facility] Provider Note   CSN: [id number] Arrival date & time: 10/27/22  2058     History  Chief Complaint  Patient presents with   Leg Pain    Brad Nash is a 42 y.o. male.  42 year old male with past medical history of diabetes presents with complaint of left leg pain and swelling.  Patient states that he was going down the steps at his house tonight when he missed a step and fell, resulting in pain to his left lower leg.  Patient had to crawl back up the stairs, took a Motrin and took a nap, when he woke up he noted significant swelling of his left lower leg which was concerning to him and prompted him to present to the emergency room.  Patient is not anticoagulated, did not hit head, no loss of consciousness and, no other injuries, complaints or concerns.       Home Medications Prior to Admission medications   Medication Sig Start Date End Date Taking? Authorizing Provider  acetaminophen (TYLENOL) 500 MG tablet Take 2 tablets (1,000 mg total) by mouth every 6 (six) hours as needed for mild pain, moderate pain, fever or headache. 12/19/20   Norm Parcel, PA-C  ELDERBERRY PO Take 1 capsule by mouth daily.    [provider]  gemfibrozil (LOPID) 600 MG tablet Take 600 mg by mouth 2 (two) times daily. 11/05/20   [provider]  lisinopril (ZESTRIL) 10 MG tablet Take 10 mg by mouth daily. 12/19/20   [provider]  metFORMIN (GLUCOPHAGE) 500 MG tablet Take 500 mg by mouth in the morning and at bedtime. 11/05/20   [provider]  Multiple Vitamin (MULTIVITAMIN WITH MINERALS) TABS tablet Take 1 tablet by mouth daily.    [provider]  Omega-3 Fatty Acids (FISH OIL PO) Take 1 capsule by mouth daily.    [provider]  Probiotic Product (PROBIOTIC DAILY PO) Take 2 tablets by mouth daily at 2 PM.    [provider]  rosuvastatin (CRESTOR) 5 MG tablet Take 5 mg by mouth  daily. 02/06/21   [provider]      Allergies    Other    Review of Systems   Review of Systems Negative except as per HPI Physical Exam Updated Vital Signs BP (!) 156/97   Pulse 92   Temp 98.7 F (37.1 C)   Resp 17   Ht 5\' 8"  (1.727 m)   Wt 115.7 kg   SpO2 95%   BMI 38.77 kg/m  Physical Exam Vitals and nursing note reviewed.  Constitutional:      General: He is not in acute distress.    Appearance: He is well-developed. He is not diaphoretic.  HENT:     Head: Normocephalic and atraumatic.  Cardiovascular:     Pulses: Normal pulses.  Pulmonary:     Effort: Pulmonary effort is normal.  Musculoskeletal:        General: Swelling and tenderness present. No deformity. Normal range of motion.     Left ankle: Swelling and ecchymosis present. Tenderness present. Normal range of motion.     Left Achilles Tendon: Normal.       Legs:     Comments: Swelling of the left lower leg through the calf, no tenderness over Achilles tendon, Achilles appear intact.  Mild bruising noted.  No sensory deficit, weakness.  Extremity is warm to the touch, pain is not out of proportion.  Skin:    General: Skin is warm and dry.     Findings: Bruising present. No erythema or rash.  Neurological:     Mental Status: He is alert and oriented to person, place, and time.     Sensory: No sensory deficit.     Motor: No weakness.  Psychiatric:        Behavior: Behavior normal.     ED Results / Procedures / Treatments   Labs (all labs ordered are listed, but only abnormal results are displayed) Labs Reviewed  BASIC METABOLIC PANEL - Abnormal; Notable for the following components:      Result Value   Sodium 133 (*)    Glucose, Bld 373 (*)    Calcium 8.8 (*)    All other components within normal limits  CBC WITH DIFFERENTIAL/PLATELET    EKG None  Radiology DG Tibia/Fibula Left  Result Date: 10/27/2022 CLINICAL DATA:  Fall and left calf swelling. EXAM: LEFT TIBIA AND FIBULA - 2  VIEW COMPARISON:  None Available. FINDINGS: No acute fracture or dislocation. The bones are well mineralized. No significant arthritic changes. There is prominence of the soft tissues of the calf concerning for hematoma. Clinical correlation is recommended. No foreign object or soft tissue air. IMPRESSION: 1. No acute fracture or dislocation. 2. Soft tissue swelling of the calf. Electronically Signed   By: Anner Crete M.D.   On: 10/27/2022 23:08    Procedures Procedures    Medications Ordered in ED Medications  oxyCODONE (Oxy IR/ROXICODONE) immediate release tablet 5 mg (5 mg Oral Given 10/27/22 2334)    ED Course/ Medical Decision Making/ A&P                             Medical Decision Making  43 year old male with complaint of left lower leg pain and swelling after he missed a step and fell down steps earlier today.  X-ray of the left lower leg as ordered in triage, interpreted by myself is negative for acute bony injury.  Labs obtained in triage with hyperglycemia with glucose of 373, history of diabetes, not in DKA.  CBC with normal platelets, unremarkable. Exam consistent with hematoma, do not suspect compartment syndrome as pain is not out of proportion, there is no sensory deficit, extremity is warm to the touch with strong DP pulse present. Recommend Motrin and Tylenol as needed as directed.  Can provide Ace wrap with crutches to weight-bear as tolerated.  Is advised to follow-up with his primary care provider for recheck.        Final Clinical Impression(s) / ED Diagnoses Final diagnoses:  Hematoma of left lower leg    Rx / DC Orders ED Discharge Orders     None         Tacy Learn, PA-C 10/28/22 0018    Regan Lemming, MD 10/28/22 [phone]

## 2022-10-28 LAB — BASIC METABOLIC PANEL
Anion gap: 7 (ref 5–15)
BUN: 18 mg/dL (ref 6–20)
CO2: 23 mmol/L (ref 22–32)
Calcium: 8.8 mg/dL — ABNORMAL LOW (ref 8.9–10.3)
Chloride: 103 mmol/L (ref 98–111)
Creatinine, Ser: 0.94 mg/dL (ref 0.61–1.24)
GFR, Estimated: >60 mL/min (ref >60)
Glucose, Bld: 373 mg/dL — ABNORMAL HIGH (ref 70–99)
Potassium: 4.2 mmol/L (ref 3.5–5.1)
Sodium: 133 mmol/L — ABNORMAL LOW (ref 135–145)

## 2022-10-28 NOTE — Discharge Instructions (Addendum)
Apply ice for 20 minutes at a time for the first 24 hours.  Can then alternate ice and warm compresses. Elevate leg when possible.  Use crutches to weight-bear as tolerated.  Can take Motrin and Tylenol as needed as directed.  Recheck with your primary care provider.

## 2023-09-20 ENCOUNTER — Emergency Department (HOSPITAL_COMMUNITY): Payer: Self-pay

## 2023-09-20 ENCOUNTER — Emergency Department (HOSPITAL_COMMUNITY)
Admission: EM | Admit: 2023-09-20 | Discharge: 2023-09-21 | Disposition: A | Discharge status: Home / Self Care | Payer: Self-pay | Attending: Emergency Medicine | Admitting: Emergency Medicine

## 2023-09-20 DIAGNOSIS — I10 Essential (primary) hypertension: Secondary | ICD-10-CM | POA: Insufficient documentation

## 2023-09-20 DIAGNOSIS — Z7984 Long term (current) use of oral hypoglycemic drugs: Secondary | ICD-10-CM | POA: Insufficient documentation

## 2023-09-20 DIAGNOSIS — Z79899 Other long term (current) drug therapy: Secondary | ICD-10-CM | POA: Insufficient documentation

## 2023-09-20 DIAGNOSIS — R079 Chest pain, unspecified: Secondary | ICD-10-CM

## 2023-09-20 DIAGNOSIS — E119 Type 2 diabetes mellitus without complications: Secondary | ICD-10-CM | POA: Insufficient documentation

## 2023-09-20 DIAGNOSIS — Z20822 Contact with and (suspected) exposure to covid-19: Secondary | ICD-10-CM | POA: Insufficient documentation

## 2023-09-20 DIAGNOSIS — R0789 Other chest pain: Secondary | ICD-10-CM | POA: Insufficient documentation

## 2023-09-20 NOTE — ED Provider Notes (Signed)
 [place] EMERGENCY DEPARTMENT AT [facility] Provider Note   CSN: [id number] Arrival date & time: 09/20/23  2316     History  Chief Complaint  Patient presents with   Chest Pain    Brad Nash is a 43 y.o. male.  The history is provided by the patient and medical records.  Chest Pain   43 y.o. M with hx of DM and HTN, presenting to the ED for chest pain.  Started when he laid down to go to sleep.  States it felt like a pinching/squeezing sensation to left side of chest. No associated SOB, nausea, vomiting, diaphoresis, etc.  States pain resolved after a minute or two but recurred again.  Took some TUMS at home without change.  No prior cardiac hx.  No family cardiac hx.  Not a smoker.  Did have some recent cough/fever and known flu exposure in family recently.  Home Medications Prior to Admission medications   Medication Sig Start Date End Date Taking? Authorizing Provider  acetaminophen  (TYLENOL ) 500 MG tablet Take 2 tablets (1,000 mg total) by mouth every 6 (six) hours as needed for mild pain, moderate pain, fever or headache. 12/19/20  Yes Vicci Burnard SAUNDERS, PA-C  ezetimibe (ZETIA) 10 MG tablet Take 10 mg by mouth daily. 07/29/23  Yes [provider]  glipiZIDE (GLUCOTROL XL) 10 MG 24 hr tablet Take 10 mg by mouth daily with breakfast.   Yes [provider]  lisinopril (ZESTRIL) 10 MG tablet Take 10 mg by mouth daily. 12/19/20  Yes [provider]  metFORMIN (GLUCOPHAGE) 500 MG tablet Take 500 mg by mouth in the morning and at bedtime. 11/05/20  Yes [provider]  Omega-3 Fatty Acids (FISH OIL PO) Take 1 capsule by mouth daily.   Yes [provider]  ELDERBERRY PO Take 1 capsule by mouth daily. Patient not taking: Reported on 09/21/2023    [provider]  gemfibrozil (LOPID) 600 MG tablet Take 600 mg by mouth 2 (two) times daily. Patient not taking: Reported on 09/21/2023 11/05/20   [provider]  Multiple  Vitamin (MULTIVITAMIN WITH MINERALS) TABS tablet Take 1 tablet by mouth daily.    [provider]  Probiotic Product (PROBIOTIC DAILY PO) Take 2 tablets by mouth daily at 2 PM. Patient not taking: Reported on 09/21/2023    [provider]  rosuvastatin (CRESTOR) 5 MG tablet Take 5 mg by mouth daily. Patient not taking: Reported on 09/21/2023 02/06/21   [provider]      Allergies    Other    Review of Systems   Review of Systems  Cardiovascular:  Positive for chest pain.  All other systems reviewed and are negative.   Physical Exam Updated Vital Signs BP (!) 157/102   Pulse 88   Temp 98.4 F (36.9 C)   Resp 16   Ht 5' 8 (1.727 m)   Wt 115.7 kg   SpO2 91%   BMI 38.77 kg/m   Physical Exam Vitals and nursing note reviewed.  Constitutional:      Appearance: He is well-developed. He is obese.  HENT:     Head: Normocephalic and atraumatic.  Eyes:     Conjunctiva/sclera: Conjunctivae normal.     Pupils: Pupils are equal, round, and reactive to light.  Cardiovascular:     Rate and Rhythm: Normal rate and regular rhythm.     Heart sounds: Normal heart sounds.  Pulmonary:     Effort: Pulmonary effort is  normal.     Breath sounds: Normal breath sounds.  Abdominal:     General: Bowel sounds are normal.     Palpations: Abdomen is soft.  Musculoskeletal:        General: Normal range of motion.     Cervical back: Normal range of motion.  Skin:    General: Skin is warm and dry.  Neurological:     Mental Status: He is alert and oriented to person, place, and time.     ED Results / Procedures / Treatments   Labs (all labs ordered are listed, but only abnormal results are displayed) Labs Reviewed  CBC - Abnormal; Notable for the following components:      Result Value   MCHC 36.9 (*)    All other components within normal limits  BASIC METABOLIC PANEL - Abnormal; Notable for the following components:   CO2 19 (*)    Glucose, Bld 342 (*)     BUN 24 (*)    Calcium 8.7 (*)    All other components within normal limits  RESP PANEL BY RT-PCR (RSV, FLU A&B, COVID)  RVPGX2  TROPONIN I (HIGH SENSITIVITY)  TROPONIN I (HIGH SENSITIVITY)    EKG EKG Interpretation Date/Time:  Sunday September 20 2023 23:28:32 EST Ventricular Rate:  96 PR Interval:  126 QRS Duration:  91 QT Interval:  343 QTC Calculation: 434 R Axis:   61  Text Interpretation: Sinus rhythm ST elev, probable normal early repol pattern Baseline wander in lead(s) V1 When compared with ECG of 11/22/2020, No significant change was found Confirmed by Raford Lenis ([id number]) on 09/21/2023 12:18:27 AM  Radiology DG Chest 2 View Result Date: 09/21/2023 CLINICAL DATA:  Chest pain Roughly 45 minutes ago patient states he was laying down and began having left sided chest pain and some dizziness. Took Tums with no relief. EXAM: CHEST - 2 VIEW COMPARISON:  Chest x-ray 09/02/2017 FINDINGS: The heart and mediastinal contours are within normal limits. No focal consolidation. No pulmonary edema. No pleural effusion. No pneumothorax. No acute osseous abnormality. IMPRESSION: No active cardiopulmonary disease. Electronically Signed   By: Morgane  Naveau M.D.   On: 09/21/2023 00:06    Procedures Procedures    Medications Ordered in ED Medications - No data to display  ED Course/ Medical Decision Making/ A&P                                 Medical Decision Making Amount and/or Complexity of Data Reviewed Labs: ordered. Radiology: ordered and independent interpretation performed. ECG/medicine tests: ordered and independent interpretation performed.   60 male presenting to the ED for chest pain.  Began when he was trying to go to sleep.  Lasted for a minute or 2 and then resolved but recurred once more.  Took some Tums without relief.  He is afebrile and nontoxic.  He is in no acute distress and hemodynamically stable.  EKG is nonischemic.  Labs are overall reassuring, does have some  hyperglycemia without findings of DKA.  Troponin negative x 2.  He does not have any tachycardia or hypoxia, no pleuritic type pain to suggest PE and no risk factors for such.  He has been resting comfortably here without recurrence of pain.  Feels like it may have been indigestion, was eating during the football game.  That he is stable for discharge.  Encourage close follow-up with PCP.  Return here for new concerns.  Final Clinical Impression(s) / ED Diagnoses Final diagnoses:  Nonspecific chest pain    Rx / DC Orders ED Discharge Orders     None         Jarold Olam HERO, PA-C 09/21/23 0400    Raford Lenis, MD 09/21/23 [phone]

## 2023-09-20 NOTE — ED Triage Notes (Signed)
 Roughly 45 minutes ago patient states he was laying down and began having left sided chest pain and some dizziness. Took Tums with no relief.

## 2023-09-21 LAB — CBC
HCT: 43.9 % (ref 39.0–52.0)
Hemoglobin: 16.2 g/dL (ref 13.0–17.0)
MCH: 30.6 pg (ref 26.0–34.0)
MCHC: 36.9 g/dL — ABNORMAL HIGH (ref 30.0–36.0)
MCV: 83 fL (ref 80.0–100.0)
Platelets: 291 10*3/uL (ref 150–400)
RBC: 5.29 MIL/uL (ref 4.22–5.81)
RDW: 13.8 % (ref 11.5–15.5)
WBC: 8.5 10*3/uL (ref 4.0–10.5)
nRBC: 0.2 % (ref 0.0–0.2)

## 2023-09-21 LAB — RESP PANEL BY RT-PCR (RSV, FLU A&B, COVID)  RVPGX2
Influenza A by PCR: NEGATIVE
Influenza B by PCR: NEGATIVE
Resp Syncytial Virus by PCR: NEGATIVE
SARS Coronavirus 2 by RT PCR: NEGATIVE

## 2023-09-21 LAB — BASIC METABOLIC PANEL
Anion gap: 15 (ref 5–15)
BUN: 24 mg/dL — ABNORMAL HIGH (ref 6–20)
CO2: 19 mmol/L — ABNORMAL LOW (ref 22–32)
Calcium: 8.7 mg/dL — ABNORMAL LOW (ref 8.9–10.3)
Chloride: 101 mmol/L (ref 98–111)
Creatinine, Ser: 0.97 mg/dL (ref 0.61–1.24)
Glucose, Bld: 342 mg/dL — ABNORMAL HIGH (ref 70–99)
Potassium: 4.8 mmol/L (ref 3.5–5.1)
Sodium: 135 mmol/L (ref 135–145)

## 2023-09-21 LAB — TROPONIN I (HIGH SENSITIVITY)
Troponin I (High Sensitivity): 5 ng/L (ref <18)
Troponin I (High Sensitivity): 5 ng/L (ref <18)

## 2023-09-21 NOTE — Discharge Instructions (Signed)
 Cardiac work-up today was normal. Recommend close follow-up with your primary care doctor. Return here for new concerns.

## 2023-09-21 NOTE — ED Notes (Signed)
 Nurse to room to attempt to straight stick patient for new light green

## 2023-09-21 NOTE — ED Notes (Signed)
Patient resting in bed with visitor at bedside.

## 2023-09-21 NOTE — ED Notes (Signed)
 Lab called and wants another light green top sent to lab due to being hemolyzed per lab.

## 2023-09-21 NOTE — ED Notes (Signed)
 Patient up walking to restroom at this time
